# Patient Record
Sex: Female | Born: 2000 | Race: Black or African American | Hispanic: No | Marital: Single | Smoking: Never smoker
Health system: Southern US, Community
[De-identification: ages and names within clinical notes are randomized; demographics above are authoritative.]

## PROBLEM LIST (undated history)

## (undated) HISTORY — PX: MOUTH SURGERY: SHX715

---
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
## 2020-02-07 ENCOUNTER — Other Ambulatory Visit: Payer: Self-pay

## 2020-02-07 ENCOUNTER — Ambulatory Visit (INDEPENDENT_AMBULATORY_CARE_PROVIDER_SITE_OTHER): Payer: Medicaid Other | Admitting: *Deleted

## 2020-02-07 DIAGNOSIS — Z3042 Encounter for surveillance of injectable contraceptive: Secondary | ICD-10-CM

## 2020-02-07 NOTE — Progress Notes (Signed)
Pt is in office for Depo injection. Pt is on time for Depo. Injection given, pt tolerated well.  Pt advised to RTO 9/9-9/23 for next depo.   Pt has no other concerns today.  Administrations This Visit    medroxyPROGESTERone (DEPO-PROVERA) injection 150 mg    Admin Date 02/07/2020 Action Given Dose 150 mg Route Intramuscular Administered By Lanney Gins, CMA

## 2020-02-07 NOTE — Progress Notes (Signed)
Patient was assessed and managed by nursing staff during this encounter. I have reviewed the chart and agree with the documentation and plan. I have also made any necessary editorial changes.  Chayden Garrelts, MD 02/07/2020 12:46 PM 

## 2020-05-01 ENCOUNTER — Ambulatory Visit (INDEPENDENT_AMBULATORY_CARE_PROVIDER_SITE_OTHER): Payer: Medicaid Other

## 2020-05-01 ENCOUNTER — Other Ambulatory Visit: Payer: Self-pay

## 2020-05-01 DIAGNOSIS — Z3042 Encounter for surveillance of injectable contraceptive: Secondary | ICD-10-CM | POA: Diagnosis not present

## 2020-05-01 MED ORDER — MEDROXYPROGESTERONE ACETATE 150 MG/ML IM SUSP
150.0000 mg | INTRAMUSCULAR | Status: DC
  Administered 2020-05-01: 150 mg via INTRAMUSCULAR

## 2020-05-01 NOTE — Progress Notes (Signed)
Patient presents for Depo Injection  Last Depo Given: 02/07/20 Next Depo Due 07/17/20-07/31/20  Pt tolerated Injection well LUOQ   Pt advised to schedule next appt for Depo injection .

## 2020-05-01 NOTE — Progress Notes (Signed)
Patient was assessed and managed by nursing staff during this encounter. I have reviewed the chart and agree with the documentation and plan. I have also made any necessary editorial changes.  Caran Storck A Mariusz Jubb, MD 05/01/2020 1:18 PM   

## 2020-07-23 ENCOUNTER — Ambulatory Visit (INDEPENDENT_AMBULATORY_CARE_PROVIDER_SITE_OTHER): Payer: Medicaid Other

## 2020-07-23 ENCOUNTER — Other Ambulatory Visit: Payer: Self-pay

## 2020-07-23 DIAGNOSIS — Z3042 Encounter for surveillance of injectable contraceptive: Secondary | ICD-10-CM

## 2020-07-23 MED ORDER — MEDROXYPROGESTERONE ACETATE 150 MG/ML IM SUSP
150.0000 mg | Freq: Once | INTRAMUSCULAR | Status: AC
  Administered 2020-07-23: 150 mg via INTRAMUSCULAR

## 2020-07-23 NOTE — Progress Notes (Signed)
Patient was assessed and managed by nursing staff during this encounter. I have reviewed the chart and agree with the documentation and plan. I have also made any necessary editorial changes.  Warden Fillers, MD 07/23/2020 1:24 PM

## 2020-07-23 NOTE — Progress Notes (Signed)
GYN presents for DEPO Injection, given in RUOQ, tolerated well.  Needs AEX/DEPO Refills  Next DEPO  Feb. 23 - Mar. 9, 2021  Administrations This Visit    medroxyPROGESTERone (DEPO-PROVERA) injection 150 mg    Admin Date 07/23/2020 Action Given Dose 150 mg Route Intramuscular Administered By Maretta Bees, RMA

## 2020-08-18 ENCOUNTER — Ambulatory Visit: Payer: Medicaid Other | Admitting: Advanced Practice Midwife

## 2020-09-08 ENCOUNTER — Other Ambulatory Visit: Payer: Self-pay

## 2020-09-08 ENCOUNTER — Ambulatory Visit (INDEPENDENT_AMBULATORY_CARE_PROVIDER_SITE_OTHER): Payer: Medicaid Other | Admitting: Advanced Practice Midwife

## 2020-09-08 ENCOUNTER — Encounter: Payer: Self-pay | Admitting: Advanced Practice Midwife

## 2020-09-08 ENCOUNTER — Other Ambulatory Visit (HOSPITAL_COMMUNITY)
Admission: RE | Admit: 2020-09-08 | Discharge: 2020-09-08 | Disposition: A | Discharge status: Home / Self Care | Payer: Medicaid Other | Source: Ambulatory Visit | Attending: Advanced Practice Midwife | Admitting: Advanced Practice Midwife

## 2020-09-08 VITALS — Ht 66.0 in | Wt 138.0 lb

## 2020-09-08 DIAGNOSIS — Z0000 Encounter for general adult medical examination without abnormal findings: Secondary | ICD-10-CM | POA: Diagnosis not present

## 2020-09-08 DIAGNOSIS — Z113 Encounter for screening for infections with a predominantly sexual mode of transmission: Secondary | ICD-10-CM

## 2020-09-08 DIAGNOSIS — B3731 Acute candidiasis of vulva and vagina: Secondary | ICD-10-CM

## 2020-09-08 DIAGNOSIS — B373 Candidiasis of vulva and vagina: Secondary | ICD-10-CM

## 2020-09-08 NOTE — Progress Notes (Signed)
Subjective:     Christine Arnold is a 20 y.o. female here at [facility] for a routine exam.  Current complaints: none.  Personal health questionnaire reviewed: yes.  Do you have a primary care provider? yes Do you feel safe at home? yes    Risk factors for chronic health problems: Smoking: No Alchohol/how much: social drinker Pt BMI: Body mass index is 22.27 kg/m.   Gynecologic History No LMP recorded. Patient has had an injection. Contraception: Depo-Provera injections Last Pap: n/a. Last mammogram: n/a  Obstetric History OB History  Gravida Para Term Preterm AB Living  0 0 0 0 0 0  SAB IAB Ectopic Multiple Live Births  0 0 0 0 0     The following portions of the patient's history were reviewed and updated as appropriate: allergies, current medications, past family history, past medical history, past social history, past surgical history and problem list.  Review of Systems Pertinent items noted in HPI and remainder of comprehensive ROS otherwise negative.    Objective:   Ht 5\' 6"  (1.676 m)   Wt 138 lb (62.6 kg)   BMI 22.27 kg/m  VS reviewed, nursing note reviewed,  Constitutional: well developed, well nourished, no distress HEENT: normocephalic CV: normal rate Pulm/chest wall: normal effort Breast Exam: Deferred with low risks and shared decision making, discussed recommendation to start mammogram between 40-50 yo/  Abdomen: soft Neuro: alert and oriented x 3 Skin: warm, dry Psych: affect normal Pelvic exam: Deferred       Assessment/Plan:   1. Screening for STD (sexually transmitted disease) --Recommend use of condoms to prevent STDs  - Cervicovaginal ancillary only( [facility]) - Hepatitis B surface antigen - Hepatitis C antibody - HIV Antibody (routine testing w rflx) - RPR    2. Encounter for well woman exam without gynecological exam --Doing well, no gyn concerns, desires to continue Depo, next injection in February   Follow up in: 1 year  or as needed.   March, CNM 2:46 PM

## 2020-09-08 NOTE — Progress Notes (Signed)
[facility] GYN patient presents for Annual Exam.  Pt has received COVID-Vaccine LMP:  Periods are on/off and light  Contraception :Depo  Last Depo Injection 07/23/20  STD Screening: Desires.   Family Hx of Breast Cancer: None   CC: None

## 2020-09-09 LAB — CERVICOVAGINAL ANCILLARY ONLY
Bacterial Vaginitis (gardnerella): NEGATIVE
Candida Glabrata: NEGATIVE
Candida Vaginitis: POSITIVE — AB
Chlamydia: NEGATIVE
Comment: NEGATIVE
Comment: NEGATIVE
Comment: NEGATIVE
Comment: NEGATIVE
Comment: NEGATIVE
Comment: NORMAL
Neisseria Gonorrhea: NEGATIVE
Trichomonas: NEGATIVE

## 2020-09-09 LAB — HEPATITIS B SURFACE ANTIGEN: Hepatitis B Surface Ag: NEGATIVE

## 2020-09-09 LAB — RPR: RPR Ser Ql: NONREACTIVE

## 2020-09-09 LAB — HEPATITIS C ANTIBODY: Hep C Virus Ab: <0.1 s/co ratio (ref 0.0–0.9)

## 2020-09-09 LAB — HIV ANTIBODY (ROUTINE TESTING W REFLEX): HIV Screen 4th Generation wRfx: NONREACTIVE

## 2020-09-16 MED ORDER — FLUCONAZOLE 150 MG PO TABS: 150.0000 mg | ORAL_TABLET | Freq: Once | ORAL | 0 refills | Status: AC

## 2020-09-16 NOTE — Addendum Note (Signed)
Addended by: Sharen Counter A on: 09/16/2020 05:24 PM   Modules accepted: Orders

## 2020-10-17 ENCOUNTER — Ambulatory Visit: Payer: Medicaid Other

## 2020-10-21 ENCOUNTER — Other Ambulatory Visit: Payer: Self-pay

## 2020-10-21 DIAGNOSIS — Z3009 Encounter for other general counseling and advice on contraception: Secondary | ICD-10-CM

## 2020-10-21 MED ORDER — MEDROXYPROGESTERONE ACETATE 150 MG/ML IM SUSP: 150.0000 mg | INTRAMUSCULAR | 4 refills | Status: DC

## 2020-10-21 NOTE — Progress Notes (Signed)
Refills for DEPO sent to pharmacy.

## 2020-12-26 ENCOUNTER — Other Ambulatory Visit: Payer: Self-pay

## 2020-12-26 ENCOUNTER — Emergency Department (HOSPITAL_COMMUNITY)
Admission: EM | Admit: 2020-12-26 | Discharge: 2020-12-27 | Disposition: A | Discharge status: Home / Self Care | Payer: Medicaid Other | Attending: Emergency Medicine | Admitting: Emergency Medicine

## 2020-12-26 ENCOUNTER — Encounter (HOSPITAL_COMMUNITY): Payer: Self-pay | Admitting: Emergency Medicine

## 2020-12-26 DIAGNOSIS — R300 Dysuria: Secondary | ICD-10-CM | POA: Diagnosis present

## 2020-12-26 DIAGNOSIS — N3000 Acute cystitis without hematuria: Secondary | ICD-10-CM | POA: Diagnosis not present

## 2020-12-26 LAB — URINALYSIS, ROUTINE W REFLEX MICROSCOPIC
Bacteria, UA: NONE SEEN
Bilirubin Urine: NEGATIVE
Glucose, UA: NEGATIVE mg/dL
Ketones, ur: NEGATIVE mg/dL
Nitrite: NEGATIVE
Protein, ur: 100 mg/dL — AB
RBC / HPF: >50 RBC/hpf — ABNORMAL HIGH (ref 0–5)
Specific Gravity, Urine: 1.018 (ref 1.005–1.030)
WBC, UA: >50 WBC/hpf — ABNORMAL HIGH (ref 0–5)
pH: 6 (ref 5.0–8.0)

## 2020-12-26 LAB — PREGNANCY, URINE: Preg Test, Ur: NEGATIVE

## 2020-12-26 NOTE — ED Provider Notes (Signed)
Emergency Medicine Provider Triage Evaluation Note  Christine Arnold , a 20 y.o. female  was evaluated in triage.  Pt complains of dysuria and right side pain x3 days.  Denies fevers, chills, nausea, vomiting.  Review of Systems  Positive: Dysuria, frequency Negative: Vaginal discharge or bleeding  Physical Exam  BP 114/77 (BP Location: Left Arm)   Pulse 93   Temp 98.2 F (36.8 C) (Oral)   Resp 18   Ht 5\' 6"  (1.676 m)   Wt 59.9 kg   SpO2 95%   BMI 21.31 kg/m  Gen:   Awake, no distress   Resp:  Normal effort  MSK:   Moves extremities without difficulty  Other:  No CVA tenderness, abdomen soft nontender.  Medical Decision Making  Medically screening exam initiated at 9:15 PM.  Appropriate orders placed.  [name] was informed that the remainder of the evaluation will be completed by another provider, this initial triage assessment does not replace that evaluation, and the importance of remaining in the ED until their evaluation is complete.  Reports compliance with Depo, does not believe she is pregnant.   Lynnette Caffey, PA-C 12/26/20 2115    2116, MD 12/26/20 [phone]

## 2020-12-26 NOTE — ED Triage Notes (Signed)
Patient is complaining of burning when urinating x 3 days. She also have right lower back pain.

## 2020-12-27 MED ORDER — CEPHALEXIN 500 MG PO CAPS
500.0000 mg | ORAL_CAPSULE | Freq: Once | ORAL | Status: AC
  Administered 2020-12-27: 500 mg via ORAL
  Filled 2020-12-27: qty 1

## 2020-12-27 MED ORDER — CEPHALEXIN 500 MG PO CAPS: 500.0000 mg | ORAL_CAPSULE | Freq: Two times a day (BID) | ORAL | 0 refills | Status: DC

## 2020-12-27 NOTE — ED Provider Notes (Signed)
[facility]-EMERGENCY DEPT Provider Note   CSN: [id number] Arrival date & time: 12/26/20  2035     History Chief Complaint  Patient presents with  . Urinary Tract Infection    Christine Arnold is a 20 y.o. female.  Patient presents to the emergency department with a chief complaint of dysuria.  She reports that the symptoms started earlier this week.  She denies any hematuria.  Denies any lower abdominal pain.  Denies any new or unusual vaginal discharge or bleeding.  She denies any fever, nausea, or vomiting.  She states that this feels like UTI, which she has had before in the past.  She denies any treatment prior to arrival.  The history is provided by the patient. No language interpreter was used.       History reviewed. No pertinent past medical history.  Patient Active Problem List   Diagnosis Date Noted  . Depo-Provera contraceptive status 02/06/2019  . Breakthrough bleeding on depo provera 02/06/2019  . PCOS (polycystic ovarian syndrome) 12/26/2017  . Irregular menses 12/14/2017    Past Surgical History:  Procedure Laterality Date  . MOUTH SURGERY     wisdom teeth     OB History    Gravida  0   Para  0   Term  0   Preterm  0   AB  0   Living  0     SAB  0   IAB  0   Ectopic  0   Multiple  0   Live Births  0           Family History  Problem Relation Age of Onset  . Glaucoma Maternal Grandmother     Social History   Tobacco Use  . Smoking status: Never Smoker  . Smokeless tobacco: Never Used  Vaping Use  . Vaping Use: Never used  Substance Use Topics  . Alcohol use: Yes    Comment: Socially  . Drug use: Yes    Types: Marijuana    Comment: Parents are unaware    Home Medications Prior to Admission medications   Medication Sig Start Date End Date Taking? Authorizing Provider  cephALEXin (KEFLEX) 500 MG capsule Take 1 capsule (500 mg total) by mouth 2 (two) times daily. 12/27/20  Yes Roxy Horseman,  PA-C  medroxyPROGESTERone (DEPO-PROVERA) 150 MG/ML injection Inject 1 mL (150 mg total) into the muscle every 3 (three) months. 10/21/20   Leftwich-Kirby, Wilmer Floor, CNM  norgestimate-ethinyl estradiol (ORTHO-CYCLEN) 0.25-35 MG-MCG tablet Take 1 tablet by mouth daily. Patient not taking: No sig reported 02/06/19   Leftwich-Kirby, Misty Stanley A, CNM  Prenatal-Fe Fum-Methf-FA w/o A (VITAFOL-NANO) 18-0.6-0.4 MG TABS Take 1 tablet by mouth daily. Patient not taking: No sig reported 12/28/17   Orvilla Cornwall A, CNM    Allergies    Patient has no known allergies.  Review of Systems   Review of Systems  All other systems reviewed and are negative.   Physical Exam Updated Vital Signs BP 127/90   Pulse 86   Temp 98.3 F (36.8 C) (Oral)   Resp 16   Ht 5\' 6"  (1.676 m)   Wt 59.9 kg   SpO2 100%   BMI 21.31 kg/m   Physical Exam Vitals and nursing note reviewed.  Constitutional:      General: She is not in acute distress.    Appearance: She is well-developed.  HENT:     Head: Normocephalic and atraumatic.  Eyes:     Conjunctiva/sclera: Conjunctivae  normal.  Cardiovascular:     Rate and Rhythm: Normal rate.     Heart sounds: No murmur heard.   Pulmonary:     Effort: Pulmonary effort is normal. No respiratory distress.  Abdominal:     General: There is no distension.  Musculoskeletal:     Cervical back: Neck supple.     Comments: Moves all extremities  Skin:    General: Skin is warm and dry.  Neurological:     Mental Status: She is alert and oriented to person, place, and time.  Psychiatric:        Mood and Affect: Mood normal.        Behavior: Behavior normal.     ED Results / Procedures / Treatments   Labs (all labs ordered are listed, but only abnormal results are displayed) Labs Reviewed  URINALYSIS, ROUTINE W REFLEX MICROSCOPIC - Abnormal; Notable for the following components:      Result Value   APPearance CLOUDY (*)    Hgb urine dipstick MODERATE (*)    Protein, ur 100  (*)    Leukocytes,Ua LARGE (*)    RBC / HPF >50 (*)    WBC, UA >50 (*)    All other components within normal limits  PREGNANCY, URINE    EKG None  Radiology No results found.  Procedures Procedures   Medications Ordered in ED Medications  cephALEXin (KEFLEX) capsule 500 mg (500 mg Oral Given 12/27/20 0031)    ED Course  I have reviewed the triage vital signs and the nursing notes.  Pertinent labs & imaging results that were available during my care of the patient were reviewed by me and considered in my medical decision making (see chart for details).    MDM Rules/Calculators/A&P                          Patient here with dysuria.  Urinalysis is consistent with UTI.  Will treat with Keflex.  Pregnancy test negative.  No focal abdominal pain on exam.  Vital signs are stable.  Final Clinical Impression(s) / ED Diagnoses Final diagnoses:  Acute cystitis without hematuria    Rx / DC Orders ED Discharge Orders         Ordered    cephALEXin (KEFLEX) 500 MG capsule  2 times daily        12/27/20 0033           Roxy Horseman, PA-C 12/27/20 0035    Glynn Octave, MD 12/27/20 [phone]

## 2021-01-08 ENCOUNTER — Other Ambulatory Visit: Payer: Self-pay

## 2021-01-08 ENCOUNTER — Ambulatory Visit (HOSPITAL_COMMUNITY)
Admission: EM | Admit: 2021-01-08 | Discharge: 2021-01-08 | Disposition: A | Discharge status: Home / Self Care | Payer: Medicaid Other | Attending: Psychiatry | Admitting: Psychiatry

## 2021-01-08 DIAGNOSIS — F39 Unspecified mood [affective] disorder: Secondary | ICD-10-CM | POA: Insufficient documentation

## 2021-01-08 DIAGNOSIS — F411 Generalized anxiety disorder: Secondary | ICD-10-CM | POA: Insufficient documentation

## 2021-01-08 NOTE — Discharge Instructions (Addendum)
   Please come to [facility] (this facility) during walk in hours for appointment with psychiatrist for further medication management and for therapists for therapy.    Walk in hours are 8-11 AM Monday through Thursday for medication management.Child and adolescent psychiatrists are only available on Wednesdays and Thursdays during walk in hours.  Therapy walk in hours are Monday-Wednesday 8 AM-1PM.   It is first come, first -serve; it is best to arrive by 7:00 AM.   On Friday from 1 pm to 4 pm for therapy intake only. Please arrive by 12:00 pm as it is  first come, first -serve.    When you arrive please go upstairs for your appointment. If you are unsure of where to go, inform the front desk that you are here for a walk in appointment and they will assist you with directions upstairs.  Address:  [address] in [place], [address] Ph: [phone]   In the event of worsening symptoms, patient is instructed to call the crisis hotline, 911 and or go to the nearest ED for appropriate evaluation and treatment of symptoms. To follow-up with his/her primary care provider for your other medical issues, concerns and or health care needs.

## 2021-01-08 NOTE — BH Assessment (Signed)
TTS triage: Patient presents to [facility] requesting assessment. She states "I'm very overwhelmed, I'm in and out of crying, I can't control my mood, I don't want to get out of bed." Patient endorses a history of passive SI. She denies SI/HI/AVH at this time. She reports using THC earlier this date.  Patient is routine.

## 2021-01-08 NOTE — ED Provider Notes (Signed)
Behavioral Health Urgent Care Medical Screening Exam  Patient Name: Christine Arnold MRN: [id number] Date of Evaluation: 01/08/21 Chief Complaint:   Diagnosis:  Final diagnoses:  Unspecified mood (affective) disorder (HCC)  Anxiety state    History of Present illness: Christine Arnold is a 20 y.o. female with no past psychiatric history who presents voluntarily for assessment of feeling overwhelmed. Pt states that she has been "feeling all over the place. I can't think straight, and I was crying for the last day". She goes on to state that she feels that her emotions have been "all over the place" for the last year "on and of"f. She states that for the most part she feels "fine" but that she has periods where "I cant be happy"; this will last anywhere from a few hours to 2 weeks. She states that she also has periods of 2-3 days where she feels "hyperactive" and that "I can get everything done". She states that the last time this occurred was the week of May 6th when she was moving out of her on campus housing at [employer] and studyding for exams and working full time. She denies decreased need for sleep and grandiosity during these periods of time. She states that during the most recent episodes she made some impulse purchases and spent about 200-300 dollars on food and things for her father. She states that she recently bought a sewing machine that was impulsive since she wants to learn how to sew. She denies SI, plan or intent. She reports passive SI at times but cites her family and friends as a reason she would never harm herself. She cites her best friend in [place] and her boyfriend in [place] as her support system and feels that she can talk to them. She denies HI/AVH/paranoia. She states that her appetite is low and has been for the last 2 years, she states that she smokes marijuana to help with this. She reports losing about 20 lbs at one point but did gain most of this back.   She reports stressors as "life in general", school and "not being able to be in 2 places at once" as she wants to spend time with her family but also people in [place]. She is currently home in [place] for the summer and is spending time between 3 different houses-her mother, sister, and grandfather. She reports that she had seen a counselor a few years ago but is interested in establishing care with one here and would be interested in medications as well. Discussed open access hours at the [facility] for medication and therapy. Consulted with TTS counselor who provided me with outpatient resources for [facility]; these were handed directly to the patient. Patient states that she will come tomorrow for walk in counseling appointment.  Past Psychiatric History: Previous Medication Trials: no Previous Psychiatric Hospitalizations: no Previous Suicide Attempts: no History of Violence: no Outpatient psychiatrist: no  Social History: Marital Status: not married Children: 0 Source of Income: not currently employed Education: sophomore at [employer] Ed: no Housing Status: with family Easy access to gun: no  Substance Use (with emphasis over the last 12 months) Recreational Drugs: marijuana Use of Alcohol: occasional, social use Tobacco Use: yes, occasional vape Rehab History: no H/O Complicated Withdrawal: no  Legal History: Past Charges/Incarcerations: no Pending charges: no  Family Psychiatric History: Mother- bipolar and GAD Sister-bipolar   Psychiatric Specialty Exam  Presentation  General Appearance:Appropriate for Environment; Casual  Eye Contact:Good  Speech:Clear and Coherent; Normal  Rate  Speech Volume:Normal  Handedness:No data recorded  Mood and Affect  Mood:Euthymic  Affect:Congruent; Appropriate   Thought Process  Thought Processes:Coherent; Goal Directed; Linear  Descriptions of Associations:Intact  Orientation:Full (Time, Place and  Person)  Thought Content:WDL    Hallucinations:None  Ideas of Reference:None  Suicidal Thoughts:No  Homicidal Thoughts:No   Sensorium  Memory:Immediate Good; Recent Good; Remote Good  Judgment:Fair  Insight:Fair   Executive Functions  Concentration:Good  Attention Span:Good  Recall:Good  Fund of Knowledge:Good  Language:Good   Psychomotor Activity  Psychomotor Activity:Normal   Assets  Assets:Communication Skills; Financial Resources/Insurance; Desire for Improvement; Housing; Physical Health; Resilience; Social Support; Vocational/Educational   Sleep  Sleep:Fair  Number of hours: No data recorded  No data recorded  Physical Exam: Physical Exam Constitutional:      Appearance: Normal appearance. She is normal weight.  HENT:     Head: Normocephalic and atraumatic.  Pulmonary:     Effort: Pulmonary effort is normal.  Neurological:     Mental Status: She is alert and oriented to person, place, and time.    Review of Systems  Constitutional: Negative for chills and fever.  HENT: Negative for hearing loss.   Eyes: Negative for discharge and redness.  Respiratory: Negative for cough.   Cardiovascular: Negative for chest pain.  Gastrointestinal: Negative for abdominal pain.  Musculoskeletal: Negative for myalgias.  Neurological: Negative for headaches.  Psychiatric/Behavioral: Positive for depression. Negative for suicidal ideas.   Blood pressure (!) 132/96, pulse 100, temperature 98 F (36.7 C), temperature source Oral, resp. rate 18, SpO2 98 %. There is no height or weight on file to calculate BMI.  Musculoskeletal: Strength & Muscle Tone: within normal limits Gait & Station: normal Patient leans: N/A   [facility] MSE Discharge Disposition for Follow up and Recommendations: Based on my evaluation the patient does not appear to have an emergency medical condition and can be discharged with resources and follow up care in outpatient services for  Medication Management and Individual Therapy   Estella Husk, MD 01/08/2021, 3:54 PM

## 2021-01-08 NOTE — ED Notes (Signed)
Pt discharged in no acute distress. Verbalized understanding of resources provided by staff on AVS along with safety plan.

## 2021-01-14 ENCOUNTER — Other Ambulatory Visit: Payer: Self-pay | Admitting: Internal Medicine

## 2021-01-15 LAB — COMPLETE METABOLIC PANEL WITH GFR
AG Ratio: 1.8 (calc) (ref 1.0–2.5)
ALT: 12 U/L (ref 5–32)
AST: 16 U/L (ref 12–32)
Albumin: 4.4 g/dL (ref 3.6–5.1)
Alkaline phosphatase (APISO): 66 U/L (ref 36–128)
BUN: 10 mg/dL (ref 7–20)
CO2: 21 mmol/L (ref 20–32)
Calcium: 9.3 mg/dL (ref 8.9–10.4)
Chloride: 107 mmol/L (ref 98–110)
Creat: 0.77 mg/dL (ref 0.50–1.00)
GFR, Est African American: 130 mL/min/{1.73_m2} (ref >=60)
GFR, Est Non African American: 112 mL/min/{1.73_m2} (ref >=60)
Globulin: 2.5 g/dL (calc) (ref 2.0–3.8)
Glucose, Bld: 83 mg/dL (ref 65–99)
Potassium: 4.1 mmol/L (ref 3.8–5.1)
Sodium: 139 mmol/L (ref 135–146)
Total Bilirubin: 0.7 mg/dL (ref 0.2–1.1)
Total Protein: 6.9 g/dL (ref 6.3–8.2)

## 2021-01-15 LAB — CBC
HCT: 40.2 % (ref 35.0–45.0)
Hemoglobin: 13.6 g/dL (ref 11.7–15.5)
MCH: 29.8 pg (ref 27.0–33.0)
MCHC: 33.8 g/dL (ref 32.0–36.0)
MCV: 88.2 fL (ref 80.0–100.0)
MPV: 9.1 fL (ref 7.5–12.5)
Platelets: 278 10*3/uL (ref 140–400)
RBC: 4.56 10*6/uL (ref 3.80–5.10)
RDW: 12 % (ref 11.0–15.0)
WBC: 5.7 10*3/uL (ref 3.8–10.8)

## 2021-01-15 LAB — LIPID PANEL
Cholesterol: 133 mg/dL (ref <170)
HDL: 38 mg/dL — ABNORMAL LOW (ref >45)
LDL Cholesterol (Calc): 78 mg/dL (calc) (ref <110)
Non-HDL Cholesterol (Calc): 95 mg/dL (calc) (ref <120)
Total CHOL/HDL Ratio: 3.5 (calc) (ref <5.0)
Triglycerides: 88 mg/dL (ref <90)

## 2021-01-15 LAB — VITAMIN D 25 HYDROXY (VIT D DEFICIENCY, FRACTURES): Vit D, 25-Hydroxy: 27 ng/mL — ABNORMAL LOW (ref 30–100)

## 2021-12-17 ENCOUNTER — Ambulatory Visit (HOSPITAL_COMMUNITY)
Admission: EM | Admit: 2021-12-17 | Discharge: 2021-12-17 | Disposition: A | Discharge status: Home / Self Care | Payer: Medicaid Other

## 2021-12-17 DIAGNOSIS — F4321 Adjustment disorder with depressed mood: Secondary | ICD-10-CM
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
## 2021-12-17 NOTE — Discharge Summary (Signed)
Christine Arnold to be D/C'd Home per NP order. An After Visit Summary was printed and given to the patient by provider. Patient escorted out and D/C home via private auto.  ?Christine Arnold  Christine Arnold  ?12/17/2021 6:42 PM ?  ?   ?

## 2021-12-17 NOTE — Discharge Instructions (Addendum)
Please follow-up with [facility] ?Address: [address] [place], [place] [address] ?Phone Number: [phone] ?Walk-in Hours: Monday- Wednesday 8a-11a. Please arrive at 7:30am. ? ?Patient is instructed prior to discharge to: ? Take all medications as prescribed by his/her mental healthcare provider. ?Report any adverse effects and or reactions from the medicines to his/her outpatient provider promptly. ?Keep all scheduled appointments, to ensure that you are getting refills on time and to avoid any interruption in your medication.  If you are unable to keep an appointment call to reschedule.  Be sure to follow-up with resources and follow-up appointments provided.  ?Patient has been instructed & cautioned: To not engage in alcohol and or illegal drug use while on prescription medicines. ?In the event of worsening symptoms, patient is instructed to call the crisis hotline, 911 and or go to the nearest ED for appropriate evaluation and treatment of symptoms. ?To follow-up with his/her primary care provider for your other medical issues, concerns and or health care needs. ?  ? ?

## 2022-01-06 ENCOUNTER — Telehealth (HOSPITAL_COMMUNITY): Payer: Self-pay

## 2022-01-06 NOTE — BH Assessment (Signed)
Care Management - [facility] Follow Up Discharges   Writer made contact with the patient.  Patient reports that received a call from [facility] and was able to set an appointment in June 2023.

## 2022-01-14 ENCOUNTER — Other Ambulatory Visit: Payer: Self-pay | Admitting: Internal Medicine

## 2022-01-15 LAB — LIPID PANEL
Cholesterol: 138 mg/dL (ref <200)
HDL: 47 mg/dL — ABNORMAL LOW (ref >=50)
LDL Cholesterol (Calc): 77 mg/dL (calc)
Non-HDL Cholesterol (Calc): 91 mg/dL (calc) (ref <130)
Total CHOL/HDL Ratio: 2.9 (calc) (ref <5.0)
Triglycerides: 56 mg/dL (ref <150)

## 2022-01-15 LAB — COMPLETE METABOLIC PANEL WITH GFR
AG Ratio: 1.9 (calc) (ref 1.0–2.5)
ALT: 12 U/L (ref 6–29)
AST: 14 U/L (ref 10–30)
Albumin: 4.5 g/dL (ref 3.6–5.1)
Alkaline phosphatase (APISO): 58 U/L (ref 31–125)
BUN: 12 mg/dL (ref 7–25)
CO2: 22 mmol/L (ref 20–32)
Calcium: 9.4 mg/dL (ref 8.6–10.2)
Chloride: 106 mmol/L (ref 98–110)
Creat: 0.57 mg/dL (ref 0.50–0.96)
Globulin: 2.4 g/dL (calc) (ref 1.9–3.7)
Glucose, Bld: 81 mg/dL (ref 65–99)
Potassium: 4.2 mmol/L (ref 3.5–5.3)
Sodium: 138 mmol/L (ref 135–146)
Total Bilirubin: 0.7 mg/dL (ref 0.2–1.2)
Total Protein: 6.9 g/dL (ref 6.1–8.1)
eGFR: 133 mL/min/{1.73_m2} (ref >=60)

## 2022-01-15 LAB — CBC
HCT: 43.4 % (ref 35.0–45.0)
Hemoglobin: 14.7 g/dL (ref 11.7–15.5)
MCH: 30.6 pg (ref 27.0–33.0)
MCHC: 33.9 g/dL (ref 32.0–36.0)
MCV: 90.2 fL (ref 80.0–100.0)
MPV: 9.2 fL (ref 7.5–12.5)
Platelets: 251 10*3/uL (ref 140–400)
RBC: 4.81 10*6/uL (ref 3.80–5.10)
RDW: 11.9 % (ref 11.0–15.0)
WBC: 4.8 10*3/uL (ref 3.8–10.8)

## 2022-01-15 LAB — VITAMIN D 25 HYDROXY (VIT D DEFICIENCY, FRACTURES): Vit D, 25-Hydroxy: 23 ng/mL — ABNORMAL LOW (ref 30–100)

## 2022-01-15 LAB — TSH: TSH: 0.73 mIU/L

## 2022-02-24 ENCOUNTER — Ambulatory Visit (HOSPITAL_COMMUNITY): Payer: No Payment, Other | Admitting: Licensed Clinical Social Worker

## 2022-05-27 ENCOUNTER — Other Ambulatory Visit (HOSPITAL_COMMUNITY)
Admission: EM | Admit: 2022-05-27 | Discharge: 2022-05-31 | Disposition: A | Discharge status: Home / Self Care | Payer: No Payment, Other | Attending: Psychiatry | Admitting: Psychiatry

## 2022-05-27 ENCOUNTER — Other Ambulatory Visit: Payer: Self-pay

## 2022-05-27 DIAGNOSIS — F332 Major depressive disorder, recurrent severe without psychotic features: Secondary | ICD-10-CM | POA: Diagnosis not present

## 2022-05-27 DIAGNOSIS — Z1152 Encounter for screening for COVID-19: Secondary | ICD-10-CM | POA: Diagnosis not present

## 2022-05-27 DIAGNOSIS — R45851 Suicidal ideations: Secondary | ICD-10-CM

## 2022-05-27 LAB — CBC WITH DIFFERENTIAL/PLATELET
Abs Immature Granulocytes: 0.01 10*3/uL (ref 0.00–0.07)
Basophils Absolute: 0 10*3/uL (ref 0.0–0.1)
Basophils Relative: 1 %
Eosinophils Absolute: 0.1 10*3/uL (ref 0.0–0.5)
Eosinophils Relative: 1 %
HCT: 39 % (ref 36.0–46.0)
Hemoglobin: 13.5 g/dL (ref 12.0–15.0)
Immature Granulocytes: 0 %
Lymphocytes Relative: 59 %
Lymphs Abs: 3.1 10*3/uL (ref 0.7–4.0)
MCH: 30.5 pg (ref 26.0–34.0)
MCHC: 34.6 g/dL (ref 30.0–36.0)
MCV: 88.2 fL (ref 80.0–100.0)
Monocytes Absolute: 0.4 10*3/uL (ref 0.1–1.0)
Monocytes Relative: 7 %
Neutro Abs: 1.7 10*3/uL (ref 1.7–7.7)
Neutrophils Relative %: 32 %
Platelets: 241 10*3/uL (ref 150–400)
RBC: 4.42 MIL/uL (ref 3.87–5.11)
RDW: 12.4 % (ref 11.5–15.5)
WBC: 5.3 10*3/uL (ref 4.0–10.5)
nRBC: 0 % (ref 0.0–0.2)

## 2022-05-27 LAB — COMPREHENSIVE METABOLIC PANEL
ALT: 13 U/L (ref 0–44)
AST: 20 U/L (ref 15–41)
Albumin: 3.9 g/dL (ref 3.5–5.0)
Alkaline Phosphatase: 59 U/L (ref 38–126)
Anion gap: 10 (ref 5–15)
BUN: 19 mg/dL (ref 6–20)
CO2: 22 mmol/L (ref 22–32)
Calcium: 9.3 mg/dL (ref 8.9–10.3)
Chloride: 106 mmol/L (ref 98–111)
Creatinine, Ser: 0.69 mg/dL (ref 0.44–1.00)
GFR, Estimated: >60 mL/min (ref >60)
Glucose, Bld: 92 mg/dL (ref 70–99)
Potassium: 4.4 mmol/L (ref 3.5–5.1)
Sodium: 138 mmol/L (ref 135–145)
Total Bilirubin: 1.2 mg/dL (ref 0.3–1.2)
Total Protein: 6.5 g/dL (ref 6.5–8.1)

## 2022-05-27 LAB — POCT PREGNANCY, URINE: Preg Test, Ur: NEGATIVE

## 2022-05-27 LAB — POCT URINE DRUG SCREEN - MANUAL ENTRY (I-SCREEN)
POC Amphetamine UR: NOT DETECTED
POC Buprenorphine (BUP): NOT DETECTED
POC Cocaine UR: NOT DETECTED
POC Marijuana UR: POSITIVE — AB
POC Methadone UR: NOT DETECTED
POC Methamphetamine UR: NOT DETECTED
POC Morphine: NOT DETECTED
POC Oxazepam (BZO): NOT DETECTED
POC Oxycodone UR: NOT DETECTED
POC Secobarbital (BAR): NOT DETECTED

## 2022-05-27 LAB — LIPID PANEL
Cholesterol: 145 mg/dL (ref 0–200)
HDL: 37 mg/dL — ABNORMAL LOW (ref >40)
LDL Cholesterol: 98 mg/dL (ref 0–99)
Total CHOL/HDL Ratio: 3.9 RATIO
Triglycerides: 48 mg/dL (ref <150)
VLDL: 10 mg/dL (ref 0–40)

## 2022-05-27 LAB — POC SARS CORONAVIRUS 2 AG: SARSCOV2ONAVIRUS 2 AG: NEGATIVE

## 2022-05-27 LAB — HEMOGLOBIN A1C
Hgb A1c MFr Bld: 5.2 % (ref 4.8–5.6)
Mean Plasma Glucose: 102.54 mg/dL

## 2022-05-27 LAB — RESP PANEL BY RT-PCR (FLU A&B, COVID) ARPGX2
Influenza A by PCR: NEGATIVE
Influenza B by PCR: NEGATIVE
SARS Coronavirus 2 by RT PCR: NEGATIVE

## 2022-05-27 LAB — TSH: TSH: 0.771 u[IU]/mL (ref 0.350–4.500)

## 2022-05-27 MED ORDER — ACETAMINOPHEN 325 MG PO TABS: 650.0000 mg | ORAL_TABLET | Freq: Four times a day (QID) | ORAL | Status: DC | PRN

## 2022-05-27 MED ORDER — MAGNESIUM HYDROXIDE 400 MG/5ML PO SUSP: 30.0000 mL | Freq: Every day | ORAL | Status: DC | PRN

## 2022-05-27 MED ORDER — SERTRALINE HCL 50 MG PO TABS
50.0000 mg | ORAL_TABLET | Freq: Every day | ORAL | Status: DC
  Administered 2022-05-27 – 2022-05-28 (×2): 50 mg via ORAL
  Filled 2022-05-27 (×2): qty 1

## 2022-05-27 MED ORDER — HYDROXYZINE HCL 25 MG PO TABS
25.0000 mg | ORAL_TABLET | Freq: Three times a day (TID) | ORAL | Status: DC | PRN
  Administered 2022-05-27 – 2022-05-29 (×2): 25 mg via ORAL
  Filled 2022-05-27 (×2): qty 1

## 2022-05-27 MED ORDER — ALUM & MAG HYDROXIDE-SIMETH 200-200-20 MG/5ML PO SUSP: 30.0000 mL | ORAL | Status: DC | PRN

## 2022-05-27 NOTE — ED Notes (Signed)
Pt in her room lying on bed. No distress noted. Will continue to monitor for safety.

## 2022-05-27 NOTE — ED Notes (Signed)
Jalessa up for dinner. Calm and cooperative. Expressed concerns of wanting something for sleep tonight. States "I am feeling better, just never thought I would be like this." No acute safety concerns at this time.
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
## 2022-05-27 NOTE — ED Notes (Signed)
In AA group meeting 

## 2022-05-27 NOTE — ED Provider Notes (Signed)
[facility] Urgent Care Continuous Assessment Admission H&P  Date: 05/27/22 Patient Name: Christine Arnold MRN: [id number] Chief Complaint: No chief complaint on file.     Diagnoses:  Final diagnoses:  Suicidal ideation  Depression, unspecified depression type  Marijuana use  Episodic mood disorder (HCC)    HPI: Christine Arnold, 21 y.o female with a history of adjustment disorder mood disorder and depression, presented to [facility] voluntarily.  According to the patient and having a really hard time, I am spiraling out of control, I am not in control of my life.  According to patient she stated she does not feel like she can be happy, angry and I keep lashing out.  According to patient she does have thoughts of suicide but stated she does not think she can get herself. Per the patient she was seeing a psychiatrist and a therapist at [facility] but she stopped seeing them because she did not have insurance.  According to patient she was taking Zoloft but the last time she took the medicine was 2-1/2 months ago because she could not afford to get it refilled. Per the patient she is currently working at hours on and she is waiting for open enrollment to start so she can get back her insurance.  According to patient she lives at home with her sister.  Triage notes: Christine Arnold- presents to [facility] voluntarily, unaccompanied at this time with complaint of worsening depression and SI, with no plan. Pt reports having difficulty managing her emotions, excessive crying, and lashing out. Pt also reports a recent break up with her boyfriend as well as a argument on Monday with her sister that resulted in her being arrested. Pt is observed very tearful and states that she is trying to hold onto her job, but her emotions are spiraling out of control. Pt was last seen at [facility] on 12/17/21 for similar presentation. Pt is not linked to psychiatrist or therapist at this time due to not having insurance, but desires to  get back on medication and begin therapy. Pt reports taking Zoloft in the past and has not had medication for over two months. Pt denies HI, AVH.  Face-to-face observation of patient, patient is alert and oriented x 4, speech is clear, patient is cooperative in answering all questions appropriately.  Upon entering the room patient is observed laying on the chair, patient is a little bit anxious, affect flat.  Patient endorsed suicidal ideations with no immediate plans but stated she do not think she can kill herself.  Patient denies HI, AVH.  Per the patient she does have paranoid feeling about life itself.  Patient reports she smoked marijuana daily, patient reported she drinks alcohol sparingly about once every 2 months.  Patient denies taking any other medicines at this time stating she was taking birth control but because she could not afford it she stopped taking it.  NP discussed with patient the importance of staying overnight observation patient is in agreement Recommend inpatient observation  [name] 2-9:   [place] ED from 12/26/2020 in [place] [facility]PT ED from 06/23/2018 in [place]  C-SSRS RISK CATEGORY No Risk No Risk        Total Time spent with patient: 20 minutes  Musculoskeletal  Strength & Muscle Tone: within normal limits Gait & Station: normal Patient leans: N/A  Psychiatric Specialty Exam  Presentation General Appearance:  Casual  Eye Contact: Fair  Speech: Clear and Coherent  Speech Volume: Normal  Handedness: Right   Mood and Affect  Mood: Anxious; Depressed  Affect: Congruent   Thought Process  Thought Processes: Coherent  Descriptions of Associations:Circumstantial  Orientation:Full (Time, Place and Person)  Thought Content:Logical    Hallucinations:Hallucinations: None  Ideas of Reference:None  Suicidal Thoughts:Suicidal Thoughts: Yes, Passive SI Passive Intent  and/or Plan: Without Plan  Homicidal Thoughts:Homicidal Thoughts: No   Sensorium  Memory: Immediate Fair  Judgment: Fair  Insight: Fair   Executive Functions  Concentration: Good  Attention Span: Good  Recall: Good  Fund of Knowledge: Good  Language: Good   Psychomotor Activity  Psychomotor Activity: Psychomotor Activity: Normal   Assets  Assets: Desire for Improvement; Resilience; Social Support   Sleep  Sleep: Sleep: Fair Number of Hours of Sleep: 5   Nutritional Assessment (For OBS and FBC admissions only) Has the patient had a weight loss or gain of 10 pounds or more in the last 3 months?: No Has the patient had a decrease in food intake/or appetite?: No Does the patient have dental problems?: No Does the patient have eating habits or behaviors that may be indicators of an eating disorder including binging or inducing vomiting?: No Has the patient recently lost weight without trying?: 0 Has the patient been eating poorly because of a decreased appetite?: 0 Malnutrition Screening Tool Score: 0    Physical Exam HENT:     Head: Normocephalic.     Nose: Nose normal.  Cardiovascular:     Rate and Rhythm: Normal rate.  Pulmonary:     Effort: Pulmonary effort is normal.  Musculoskeletal:        General: Normal range of motion.  Neurological:     General: No focal deficit present.     Mental Status: She is alert.  Psychiatric:        Mood and Affect: Mood normal.        Behavior: Behavior normal.        Thought Content: Thought content normal.        Judgment: Judgment normal.    Review of Systems  Constitutional: Negative.   HENT: Negative.    Eyes: Negative.   Respiratory: Negative.    Cardiovascular: Negative.   Gastrointestinal: Negative.   Genitourinary: Negative.   Musculoskeletal: Negative.   Skin: Negative.   Neurological: Negative.   Endo/Heme/Allergies: Negative.   Psychiatric/Behavioral:  Positive for depression,  substance abuse and suicidal ideas. The patient is nervous/anxious.     Blood pressure 108/72, pulse 71, temperature 98.9 F (37.2 C), temperature source Oral, resp. rate 16, SpO2 100 %. There is no height or weight on file to calculate BMI.  Past Psychiatric History: Mood disorder, depression, adjustment disorder.  Is the patient at risk to self? Yes  Has the patient been a risk to self in the past 6 months? Yes .    Has the patient been a risk to self within the distant past? Yes   Is the patient a risk to others? No   Has the patient been a risk to others in the past 6 months? No   Has the patient been a risk to others within the distant past? No   Past Medical History: No past medical history on file.  Past Surgical History:  Procedure Laterality Date   MOUTH SURGERY     wisdom teeth    Family History:  Family History  Problem Relation Age of Onset   Glaucoma Maternal Grandmother     Social History:  Social History  Socioeconomic History   Marital status: Single    Spouse name: Not on file   Number of children: Not on file   Years of education: Not on file   Highest education level: Not on file  Occupational History   Not on file  Tobacco Use   Smoking status: Never   Smokeless tobacco: Never  Vaping Use   Vaping Use: Never used  Substance and Sexual Activity   Alcohol use: Yes    Comment: Socially   Drug use: Yes    Types: Marijuana    Comment: Parents are unaware   Sexual activity: Yes    Partners: Male    Birth control/protection: Injection  Other Topics Concern   Not on file  Social History Narrative   Not on file   Social Determinants of Health   Financial Resource Strain: Not on file  Food Insecurity: Not on file  Transportation Needs: Not on file  Physical Activity: Not on file  Stress: Not on file  Social Connections: Not on file  Intimate Partner Violence: Not on file    SDOH:  SDOH Screenings   Tobacco Use: Low Risk  (12/26/2020)     Last Labs:  Admission on 05/27/2022  Component Date Value Ref Range Status   POC Amphetamine UR 05/27/2022 None Detected  NONE DETECTED (Cut Off Level 1000 ng/mL) Preliminary   POC Secobarbital (BAR) 05/27/2022 None Detected  NONE DETECTED (Cut Off Level 300 ng/mL) Preliminary   POC Buprenorphine (BUP) 05/27/2022 None Detected  NONE DETECTED (Cut Off Level 10 ng/mL) Preliminary   POC Oxazepam (BZO) 05/27/2022 None Detected  NONE DETECTED (Cut Off Level 300 ng/mL) Preliminary   POC Cocaine UR 05/27/2022 None Detected  NONE DETECTED (Cut Off Level 300 ng/mL) Preliminary   POC Methamphetamine UR 05/27/2022 None Detected  NONE DETECTED (Cut Off Level 1000 ng/mL) Preliminary   POC Morphine 05/27/2022 None Detected  NONE DETECTED (Cut Off Level 300 ng/mL) Preliminary   POC Methadone UR 05/27/2022 None Detected  NONE DETECTED (Cut Off Level 300 ng/mL) Preliminary   POC Oxycodone UR 05/27/2022 None Detected  NONE DETECTED (Cut Off Level 100 ng/mL) Preliminary   POC Marijuana UR 05/27/2022 Positive (A)  NONE DETECTED (Cut Off Level 50 ng/mL) Preliminary   SARSCOV2ONAVIRUS 2 AG 05/27/2022 NEGATIVE  NEGATIVE Final   Comment: (NOTE) SARS-CoV-2 antigen NOT DETECTED.   Negative results are presumptive.  Negative results do not preclude SARS-CoV-2 infection and should not be used as the sole basis for treatment or other patient management decisions, including infection  control decisions, particularly in the presence of clinical signs and  symptoms consistent with COVID-19, or in those who have been in contact with the virus.  Negative results must be combined with clinical observations, patient history, and epidemiological information. The expected result is Negative.  Fact Sheet for Patients: HandmadeRecipes.com.cy  Fact Sheet for Healthcare Providers: FuneralLife.at  This test is not yet approved or cleared by the [place] FDA and  has been  authorized for detection and/or diagnosis of SARS-CoV-2 by FDA under an Emergency Use Authorization (EUA).  This EUA will remain in effect (meaning this test can be used) for the duration of  the COV                          ID-19 declaration under Section 564(b)(1) of the Act, 21 U.S.C. section 360bbb-3(b)(1), unless the authorization is terminated or revoked sooner.    Orders Only on  01/14/2022  Component Date Value Ref Range Status   Cholesterol 01/14/2022 138  <200 mg/dL Final   HDL 01/14/2022 47 (L)  > OR = 50 mg/dL Final   Triglycerides 01/14/2022 56  <150 mg/dL Final   LDL Cholesterol (Calc) 01/14/2022 77  mg/dL (calc) Final   Comment: Reference range: <100 . Desirable range <100 mg/dL for primary prevention;   <70 mg/dL for patients with CHD or diabetic patients  with > or = 2 CHD risk factors. Marland Kitchen LDL-C is now calculated using the Martin-Hopkins  calculation, which is a validated novel method providing  better accuracy than the Friedewald equation in the  estimation of LDL-C.  Cresenciano Genre et al. Annamaria Helling. 1275;170(01): 2061-2068  (http://education.[facility].com/faq/FAQ164)    Total CHOL/HDL Ratio 01/14/2022 2.9  <5.0 (calc) Final   Non-HDL Cholesterol (Calc) 01/14/2022 91  <130 mg/dL (calc) Final   Comment: For patients with diabetes plus 1 major ASCVD risk  factor, treating to a non-HDL-C goal of <100 mg/dL  (LDL-C of <70 mg/dL) is considered a therapeutic  option.    Glucose, Bld 01/14/2022 81  65 - 99 mg/dL Final   Comment: .            Fasting reference interval .    BUN 01/14/2022 12  7 - 25 mg/dL Final   Creat 01/14/2022 0.57  0.50 - 0.96 mg/dL Final   eGFR 01/14/2022 133  > OR = 60 mL/min/1.89m Final   Comment: The eGFR is based on the CKD-EPI 2021 equation. To calculate  the new eGFR from a previous Creatinine or Cystatin C result, go to https://www.kidney.org/professionals/ kdoqi/gfr%5Fcalculator    BUN/Creatinine Ratio 074/94/4967NOT APPLICABLE  6 -  22 (calc) Final   Sodium 01/14/2022 138  135 - 146 mmol/L Final   Potassium 01/14/2022 4.2  3.5 - 5.3 mmol/L Final   Chloride 01/14/2022 106  98 - 110 mmol/L Final   CO2 01/14/2022 22  20 - 32 mmol/L Final   Calcium 01/14/2022 9.4  8.6 - 10.2 mg/dL Final   Total Protein 01/14/2022 6.9  6.1 - 8.1 g/dL Final   Albumin 01/14/2022 4.5  3.6 - 5.1 g/dL Final   Globulin 01/14/2022 2.4  1.9 - 3.7 g/dL (calc) Final   AG Ratio 01/14/2022 1.9  1.0 - 2.5 (calc) Final   Total Bilirubin 01/14/2022 0.7  0.2 - 1.2 mg/dL Final   Alkaline phosphatase (APISO) 01/14/2022 58  31 - 125 U/L Final   AST 01/14/2022 14  10 - 30 U/L Final   ALT 01/14/2022 12  6 - 29 U/L Final   Vit D, 25-Hydroxy 01/14/2022 23 (L)  30 - 100 ng/mL Final   Comment: Vitamin D Status         25-OH Vitamin D: . Deficiency:                    <20 ng/mL Insufficiency:             20 - 29 ng/mL Optimal:                 > or = 30 ng/mL . For 25-OH Vitamin D testing on patients on  D2-supplementation and patients for whom quantitation  of D2 and D3 fractions is required, the [facility]ureD(TM) 25-OH VIT D, (D2,D3), LC/MS/MS is recommended: order  code [id number](patients >272yr. See Note 1 . Note 1 . For additional information, please refer to  http://education.[facility].com/faq/FAQ199  (This link is being provided for informational/ educational purposes only.)  TSH 01/14/2022 0.73  mIU/L Final   Comment:           Reference Range .           > or = 20 Years  0.40-4.50 .                Pregnancy Ranges           First trimester    0.26-2.66           Second trimester   0.55-2.73           Third trimester    0.43-2.91    WBC 01/14/2022 4.8  3.8 - 10.8 Thousand/uL Final   RBC 01/14/2022 4.81  3.80 - 5.10 Million/uL Final   Hemoglobin 01/14/2022 14.7  11.7 - 15.5 g/dL Final   HCT 01/14/2022 43.4  35.0 - 45.0 % Final   MCV 01/14/2022 90.2  80.0 - 100.0 fL Final   MCH 01/14/2022 30.6  27.0 - 33.0 pg Final   MCHC  01/14/2022 33.9  32.0 - 36.0 g/dL Final   RDW 01/14/2022 11.9  11.0 - 15.0 % Final   Platelets 01/14/2022 251  140 - 400 Thousand/uL Final   MPV 01/14/2022 9.2  7.5 - 12.5 fL Final    Allergies: Patient has no known allergies.  PTA Medications: (Not in a hospital admission)   Medical Decision Making  Inpatient observation Meds ordered this encounter  Medications   acetaminophen (TYLENOL) tablet 650 mg   alum & mag hydroxide-simeth (MAALOX/MYLANTA) 200-200-20 MG/5ML suspension 30 mL   magnesium hydroxide (MILK OF MAGNESIA) suspension 30 mL    Lab Orders         Resp Panel by RT-PCR (Flu A&B, Covid) Anterior Nasal Swab         CBC with Differential/Platelet         Comprehensive metabolic panel         Hemoglobin A1c         Lipid panel         TSH         Pregnancy, urine         POCT Urine Drug Screen - (I-Screen)         POC SARS Coronavirus 2 Ag        Recommendations  Based on my evaluation the patient does not appear to have an emergency medical condition.  Evette Georges, NP 05/27/22  5:10 AM

## 2022-05-27 NOTE — ED Notes (Signed)
Patient is awake and sitting on the bed tearful, myself and Kia spoke with patient. Patient is sad about the state of mind she is in and feeling hopeless about life and the direction she is going in. She has support of her sister, however feels bad that she can not be there for her little sister like she would like to be right now. Patient has been given Cereal for breakfast.

## 2022-05-27 NOTE — ED Notes (Signed)
Patient is quietly lying in bed, no distress noted, will continue monitor for safety

## 2022-05-27 NOTE — ED Notes (Signed)
Christine Arnold admitted to [facility] FB Crisis. Patient A/O x4. Patient calm, cooperative and tearful during assessment. Skin assessment completed by writer with Mechele Claude, RN present. Endorses increased anxiety and depression. Endorses passive SI, states that "I don't think I could ever kill myself." Denies HI and auditory/visual hallucinations. Oriented to the unit. All forms completed. Patient states that "I am trying to keep my head on straight." Belongings remain in the locker. No acute safety concerns at this time.
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
## 2022-05-27 NOTE — ED Notes (Signed)
Pt A&O x 4, presents with suicidal ideations, no plan noted.  Denies HI or AVH.  Pt calm & cooperative, no distress noted.  Monitoring for safety.

## 2022-05-27 NOTE — BH Assessment (Signed)
Comprehensive Clinical Assessment (CCA) Note  05/27/2022 Christine Arnold [id number]  Disposition: Christine Guadeloupe, NP, recommends continuous observation for safety and stabilization with psych reassessment in the AM.   The patient demonstrates the following risk factors for suicide: Chronic risk factors for suicide include: psychiatric disorder of depression . Acute risk factors for suicide include: family or marital conflict. Protective factors for this patient include: responsibility to others (children, family), coping skills, and hope for the future. Considering these factors, the overall suicide risk at this point appears to be high. Patient is not appropriate for outpatient follow up.  Flowsheet Row ED from 12/26/2020 in [facility] from 06/23/2018 in [facility]  C-SSRS RISK CATEGORY No Risk No Risk      Christine Arnold is a 21 year old female presenting voluntary to [facility] due to [facility] with no plan. Patient denied HI, psychosis and alcohol. Patient reported needing control over her life. Patient reported stressors/triggers include recent break up with boyfriend and this past Monday she receiving a felony charge of assault with a deadly weapon (knife), stating she got into an argument with her sister, no additional information provided. Patient reported due to no longer having insurance she had to stop seeing therapist. Patient reported taking Zoloft  2.5 months ago and stopped because she couldn't afford to get refill of medications. Patient reported she is waiting for open enrollment on her job so she can get insurance and start back with medication management and therapy. Patient reported difficulty managing her emotions, evidenced by excessive crying and lashing out. Patient reported worsening depressive symptoms.   Patient resides with sister. Patient reported family discord. Patient  is currently employed. Patient reported no work related stressors. Patient was anxious, tearful and cooperative during assessment.  Chief Complaint:  Chief Complaint  Patient presents with   Suicidal   Visit Diagnosis:  Major depressive disorder  CCA Screening, Triage and Referral (STR)  Patient Reported Information How did you hear about [place]? Self  What Is the Reason for Your Visit/Call Today? Pt presents to [facility] voluntarily, unaccompanied at this time with complaint of worsening depression and SI, with no plan. Pt reports having difficulty managing her emotions, excessive crying, and lashing out. Pt also reports a recent break up with her boyfriend as well as a argument on Monday with her sister that resulted in her being arrested. Pt is observed very tearful and states that she is trying to hold onto her job, but her emotions are spiraling out of control. Pt was last seen at [facility] on 12/17/21 for similar presentation. Pt is not linked to psychiatrist or therapist at this time due to not having insurance, but desires to get back on medication and therapy. Pt reports taking Zoloft in the past and has not had medication for over two months. Pt denies HI, AVH.  How Long Has This Been Causing You Problems? > than 6 months  What Do You Feel Would Help You the Most Today? Treatment for Depression or other mood problem; Medication(s); Stress Management; Social Support   Have You Recently Had Any Thoughts About Hurting Yourself? Yes  Are You Planning to Commit Suicide/Harm Yourself At This time? No   Have you Recently Had Thoughts About Hurting Someone Christine Arnold? No  Are You Planning to Harm Someone at This Time? No  Explanation: No data recorded  Have You Used Any Alcohol or Drugs in the Past 24 Hours? Yes  How Long Ago Did You Use Drugs or Alcohol? No data recorded What Did You Use and How Much? THC, 1 gram   Do You Currently Have a Therapist/Psychiatrist? No  Name of  Therapist/Psychiatrist: No data recorded  Have You Been Recently Discharged From Any Office Practice or Programs? No  Explanation of Discharge From Practice/Program: No data recorded    CCA Screening Triage Referral Assessment Type of Contact: Face-to-Face  Telemedicine Service Delivery:   Is this Initial or Reassessment? No data recorded Date Telepsych consult ordered in [facility]:  No data recorded Time Telepsych consult ordered in [facility]:  No data recorded Location of Assessment: [facility] Assessment Services  Provider Location: [facility]   Collateral Involvement: none reported   Does Patient Have a [place]? No  Legal Guardian Contact Information: No data recorded Copy of Legal Guardianship Form: No data recorded Legal Guardian Notified of Arrival: No data recorded Legal Guardian Notified of Pending Discharge: No data recorded If Minor and Not Living with Parent(s), Who has Custody? No data recorded Is CPS involved or ever been involved? Never  Is APS involved or ever been involved? Never   Patient Determined To Be At Risk for Harm To Self or Others Based on Review of Patient Reported Information or Presenting Complaint? No data recorded Method: No data recorded Availability of Means: No data recorded Intent: No data recorded Notification Required: No data recorded Additional Information for Danger to Others Potential: No data recorded Additional Comments for Danger to Others Potential: No data recorded Are There Guns or Other Weapons in Your Home? No data recorded Types of Guns/Weapons: No data recorded Are These Weapons Safely Secured?                            No data recorded Who Could Verify You Are Able To Have These Secured: No data recorded Do You Have any Outstanding Charges, Pending Court Dates, Parole/Probation? No data recorded Contacted To Inform of Risk of Harm To Self or Others: No data recorded   Does Patient Present  under Involuntary Commitment? No  IVC Papers Initial File Date: No data recorded  [place] of Residence: [place]   Patient Currently Receiving the Following Services: Not Receiving Services   Determination of Need: Urgent (48 hours)   Options For Referral: Other: Comment; Outpatient Therapy; Medication Management; [facility] (Observation)     CCA Biopsychosocial Patient Reported Schizophrenia/Schizoaffective Diagnosis in Past: No data recorded  Strengths: self-awareness   Mental Health Symptoms Depression:   Worthlessness; Hopelessness; Fatigue; Difficulty Concentrating; Change in energy/activity; Tearfulness; Irritability   Duration of Depressive symptoms:  Duration of Depressive Symptoms: Greater than two weeks   Mania:   None   Anxiety:    Worrying; Tension; Sleep; Restlessness; Irritability; Fatigue   Psychosis:   None   Duration of Psychotic symptoms:    Trauma:   None   Obsessions:   None   Compulsions:   None   Inattention:   None   Hyperactivity/Impulsivity:   None   Oppositional/Defiant Behaviors:   None   Emotional Irregularity:   None   Other Mood/Personality Symptoms:  No data recorded   Mental Status Exam Appearance and self-care  Stature:   Average   Weight:   Average weight   Clothing:   Age-appropriate   Grooming:   Normal   Cosmetic use:   None   Posture/gait:   Normal  Motor activity:   Not Remarkable   Sensorium  Attention:   Normal   Concentration:   Normal   Orientation:   X5   Recall/memory:   Normal   Affect and Mood  Affect:   Anxious; Appropriate; Depressed   Mood:   Anxious; Depressed   Relating  Eye contact:   Normal   Facial expression:   Anxious; Depressed; Sad; Tense   Attitude toward examiner:   Cooperative   Thought and Language  Speech flow:  Normal   Thought content:   Appropriate to Mood and Circumstances   Preoccupation:   None   Hallucinations:    None   Organization:  No data recorded  [employer] of Knowledge:   Average   Intelligence:   Average   Abstraction:   Normal   Judgement:   Dangerous   Reality Testing:   -- Rich Reining)   Insight:   Lacking   Decision Making:   Impulsive   Social Functioning  Social Maturity:   Impulsive   Social Judgement:   "Street Smart"   Stress  Stressors:   Family conflict; Relationship   Coping Ability:   Overwhelmed; Exhausted   Skill Deficits:   Decision making; Self-control; Communication   Supports:   Family; Friends/Service system; Support needed     Religion: Religion/Spirituality Are You A Religious Person?:  Rich Reining)  Leisure/Recreation: Leisure / Recreation Do You Have Hobbies?:  Rich Reining)  Exercise/Diet: Exercise/Diet Do You Exercise?: No Have You Gained or Lost A Significant Amount of Weight in the Past Six Months?: No Do You Follow a Special Diet?: No Do You Have Any Trouble Sleeping?: No   CCA Employment/Education Employment/Work Situation: Employment / Work Clinical biochemist has Been Impacted by Current Illness: No Has Patient ever Been in the [employer]?: No  Education: Education Is Patient Currently Attending School?: No Last Grade Completed: 12 Did You Product manager?: No Did You Have An Individualized Education Program (IIEP):  Rich Reining) Did You Have Any Difficulty At School?:  Rich Reining) Patient's Education Has Been Impacted by Current Illness:  (uta)   CCA Family/Childhood History Family and Relationship History: Family history Marital status: Single Does patient have children?: No  Childhood History:  Childhood History By whom was/is the patient raised?: Mother Did patient suffer any verbal/emotional/physical/sexual abuse as a child?: No Did patient suffer from severe childhood neglect?: No Has patient ever been sexually abused/assaulted/raped as an adolescent or adult?: No Was the patient ever a victim of a crime or  a disaster?: No Witnessed domestic violence?:  (uta) Has patient been affected by domestic violence as an adult?:  Industrial/product designer)  Child/Adolescent Assessment:     CCA Substance Use Alcohol/Drug Use: Alcohol / Drug Use Pain Medications: see MAR Prescriptions: see MAR Over the Counter: see MAR History of alcohol / drug use?: No history of alcohol / drug abuse                         ASAM's:  Six Dimensions of Multidimensional Assessment  Dimension 1:  Acute Intoxication and/or Withdrawal Potential:      Dimension 2:  Biomedical Conditions and Complications:      Dimension 3:  Emotional, Behavioral, or Cognitive Conditions and Complications:     Dimension 4:  Readiness to Change:     Dimension 5:  Relapse, Continued use, or Continued Problem Potential:     Dimension 6:  Recovery/Living Environment:     ASAM Severity Score:  ASAM Recommended Level of Treatment:     Substance use Disorder (SUD)    Recommendations for Services/Supports/Treatments: Recommendations for Services/Supports/Treatments Recommendations For Services/Supports/Treatments: Medication Management, Individual Therapy, Other (Comment), Inpatient Hospitalization (observation)  Discharge Disposition:    DSM5 Diagnoses: Patient Active Problem List   Diagnosis Date Noted   Depo-Provera contraceptive status 02/06/2019   Breakthrough bleeding on depo provera 02/06/2019   PCOS (polycystic ovarian syndrome) 12/26/2017   Irregular menses 12/14/2017     Referrals to Alternative Service(s): Referred to Alternative Service(s):   Place:   Date:   Time:    Referred to Alternative Service(s):   Place:   Date:   Time:    Referred to Alternative Service(s):   Place:   Date:   Time:    Referred to Alternative Service(s):   Place:   Date:   Time:     Burnetta Sabin, [facility]

## 2022-05-27 NOTE — ED Notes (Signed)
Patient currently sleeping, no distress noted,  will continue to monitor patient for safety

## 2022-05-28 DIAGNOSIS — Z1152 Encounter for screening for COVID-19: Secondary | ICD-10-CM | POA: Diagnosis not present

## 2022-05-28 DIAGNOSIS — R45851 Suicidal ideations: Secondary | ICD-10-CM | POA: Diagnosis not present

## 2022-05-28 DIAGNOSIS — F332 Major depressive disorder, recurrent severe without psychotic features: Secondary | ICD-10-CM | POA: Diagnosis not present

## 2022-05-28 MED ORDER — VENLAFAXINE HCL ER 75 MG PO CP24
75.0000 mg | ORAL_CAPSULE | Freq: Every day | ORAL | Status: DC
  Administered 2022-05-29 – 2022-05-31 (×3): 75 mg via ORAL
  Filled 2022-05-28 (×3): qty 1

## 2022-05-28 NOTE — ED Notes (Signed)
Pt sleeping at present. No distress noted. Will continue to monitor for safety 

## 2022-05-28 NOTE — ED Notes (Signed)
Patient A&Ox4. Denies intent to harm self/others when asked. Denies A/VH. Patient denies any physical complaints when asked. No acute distress noted. Routine safety checks conducted according to facility protocol. Encouraged patient to notify staff if thoughts of harm toward self or others arise. Patient verbalize understanding and agreement. Will continue to monitor for safety.    

## 2022-05-28 NOTE — ED Notes (Signed)
Patient A&Ox4. Denies intent to harm self/others when asked. Denies A/VH. Patient denies any physical complaints when asked. No acute distress noted. Encouraged patient to notify staff if thoughts of harm toward self or others arise. Patient verbalize understanding and agreement. Will continue to monitor for safety.

## 2022-05-28 NOTE — ED Notes (Signed)
Patient A&Ox4. Denies intent to harm self/others when asked. Denies A/VH. Patient denies any physical complaints when asked. No acute distress noted. Sitting in dayroom puzzling. Routine safety checks conducted according to facility protocol. Encouraged patient to notify staff if thoughts of harm toward self or others arise. Patient verbalize understanding and agreement. Will continue to monitor for safety.

## 2022-05-28 NOTE — ED Notes (Signed)
Patient attending AAA meeting. 

## 2022-05-28 NOTE — ED Notes (Signed)
Pt resting in bed. A&O x4, calm and cooperative. Denies current SI/HI/AVH. No signs of distress noted. Monitoring for safety. 

## 2022-05-28 NOTE — ED Notes (Signed)
Pt asleep in bed. Respirations even and unlabored. Monitoring for safety. 

## 2022-05-28 NOTE — ED Notes (Signed)
Pt sitting in dayroom. Denies concerns or needs at present. Pt pleasant. Denies SI/HI/AVH. Pt working on puzzle. Informed pt to notify staff with any needs or concerns. Will continue to monitor for safety.
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
## 2022-05-29 DIAGNOSIS — Z1152 Encounter for screening for COVID-19: Secondary | ICD-10-CM | POA: Diagnosis not present

## 2022-05-29 DIAGNOSIS — F332 Major depressive disorder, recurrent severe without psychotic features: Secondary | ICD-10-CM | POA: Diagnosis not present

## 2022-05-29 DIAGNOSIS — R45851 Suicidal ideations: Secondary | ICD-10-CM | POA: Diagnosis not present

## 2022-05-29 NOTE — ED Notes (Signed)
Pt asleep in bed. Respirations even and unlabored. Monitoring for safety. 

## 2022-05-29 NOTE — ED Notes (Signed)
Snacks given 

## 2022-05-29 NOTE — ED Provider Notes (Signed)
Behavioral Health Progress Note  Date and Time: 05/29/2022 1:16 PM Name: Christine Arnold MRN:  [id number]  Subjective:   Christine Arnold is a 21 yr old female who presented to [facility] on 10/12 and was admitted to [facility] for worsening depression and SI.  PPHx is significant for MDD and no History of Suicide Attempts or Hospitalizations.  She reports that she is doing better today.  She reports her mood is better.  She reports that while she still has racing thoughts they are starting to slow down.  She reports taking her first dose of the Effexor this morning.  She reports her stomach is feeling a little different but thinks it is due to the coffee from this morning and not her first dose of Effexor.  Discussed that we would continue to monitor her and if needed could make further changes.  She reports no SI, HI, or AVH.  She reports her sleep is good.  She reports her appetite is doing good.  He reports no other concerns at present.  Diagnosis:  Final diagnoses:  Suicidal ideation  Severe episode of recurrent major depressive disorder, without psychotic features ([place])    Total Time spent with patient: 20 minutes  Past Psychiatric History: MDD and no History of Suicide Attempts or Hospitalizations. Past Medical History: No past medical history on file.  Past Surgical History:  Procedure Laterality Date   MOUTH SURGERY     wisdom teeth   Family History:  Family History  Problem Relation Age of Onset   Glaucoma Maternal Grandmother    Family Psychiatric  History: Mother- Bipolar Disorder, OCD Father- possible ADHD and Dyslexia Social History:  Social History   Substance and Sexual Activity  Alcohol Use Yes   Comment: Socially     Social History   Substance and Sexual Activity  Drug Use Yes   Types: Marijuana   Comment: Parents are unaware    Social History   Socioeconomic History   Marital status: Single    Spouse name: Not on file   Number of children: Not on file    Years of education: Not on file   Highest education level: Not on file  Occupational History   Not on file  Tobacco Use   Smoking status: Never   Smokeless tobacco: Never  Vaping Use   Vaping Use: Never used  Substance and Sexual Activity   Alcohol use: Yes    Comment: Socially   Drug use: Yes    Types: Marijuana    Comment: Parents are unaware   Sexual activity: Yes    Partners: Male    Birth control/protection: Injection  Other Topics Concern   Not on file  Social History Narrative   Not on file   Social Determinants of Health   Financial Resource Strain: Not on file  Food Insecurity: Not on file  Transportation Needs: Not on file  Physical Activity: Not on file  Stress: Not on file  Social Connections: Not on file   SDOH:  SDOH Screenings   Depression (PHQ2-9): High Risk (05/29/2022)  Tobacco Use: Low Risk  (12/26/2020)   Additional Social History:    Pain Medications: see MAR Prescriptions: see MAR Over the Counter: see MAR History of alcohol / drug use?: No history of alcohol / drug abuse                    Sleep: Good  Appetite:  Good  Current Medications:  Current Facility-Administered Medications  Medication  Dose Route Frequency Provider Last Rate Last Admin   acetaminophen (TYLENOL) tablet 650 mg  650 mg Oral Q6H PRN Evette Georges, NP       alum & mag hydroxide-simeth (MAALOX/MYLANTA) 200-200-20 MG/5ML suspension 30 mL  30 mL Oral Q4H PRN Evette Georges, NP       hydrOXYzine (ATARAX) tablet 25 mg  25 mg Oral TID PRN White, Patrice L, NP   25 mg at 05/27/22 2138   magnesium hydroxide (MILK OF MAGNESIA) suspension 30 mL  30 mL Oral Daily PRN Evette Georges, NP       medroxyPROGESTERone (DEPO-PROVERA) injection 150 mg  150 mg Intramuscular Q90 days Woodroe Mode, MD   150 mg at 02/07/20 1151   medroxyPROGESTERone (DEPO-PROVERA) injection 150 mg  150 mg Intramuscular Q90 days Griffin Basil, MD   150 mg at 05/01/20 1132   venlafaxine XR  (EFFEXOR-XR) 24 hr capsule 75 mg  75 mg Oral Q breakfast Corky Sox, MD   75 mg at 05/29/22 0076   Current Outpatient Medications  Medication Sig Dispense Refill   medroxyPROGESTERone (DEPO-PROVERA) 150 MG/ML injection Inject 1 mL (150 mg total) into the muscle every 3 (three) months. 1 mL 4   sertraline (ZOLOFT) 50 MG tablet Take 50 mg by mouth daily.      Labs  Lab Results:  Admission on 05/27/2022  Component Date Value Ref Range Status   SARS Coronavirus 2 by RT PCR 05/27/2022 NEGATIVE  NEGATIVE Final   Comment: (NOTE) SARS-CoV-2 target nucleic acids are NOT DETECTED.  The SARS-CoV-2 RNA is generally detectable in upper respiratory specimens during the acute phase of infection. The lowest concentration of SARS-CoV-2 viral copies this assay can detect is 138 copies/mL. A negative result does not preclude SARS-Cov-2 infection and should not be used as the sole basis for treatment or other patient management decisions. A negative result may occur with  improper specimen collection/handling, submission of specimen other than nasopharyngeal swab, presence of viral mutation(s) within the areas targeted by this assay, and inadequate number of viral copies(<138 copies/mL). A negative result must be combined with clinical observations, patient history, and epidemiological information. The expected result is Negative.  Fact Sheet for Patients:  EntrepreneurPulse.com.au  Fact Sheet for Healthcare Providers:  IncredibleEmployment.be  This test is no                          t yet approved or cleared by the [place] FDA and  has been authorized for detection and/or diagnosis of SARS-CoV-2 by FDA under an Emergency Use Authorization (EUA). This EUA will remain  in effect (meaning this test can be used) for the duration of the COVID-19 declaration under Section 564(b)(1) of the Act, 21 U.S.C.section 360bbb-3(b)(1), unless the authorization is  terminated  or revoked sooner.       Influenza A by PCR 05/27/2022 NEGATIVE  NEGATIVE Final   Influenza B by PCR 05/27/2022 NEGATIVE  NEGATIVE Final   Comment: (NOTE) The Xpert Xpress SARS-CoV-2/FLU/RSV plus assay is intended as an aid in the diagnosis of influenza from Nasopharyngeal swab specimens and should not be used as a sole basis for treatment. Nasal washings and aspirates are unacceptable for Xpert Xpress SARS-CoV-2/FLU/RSV testing.  Fact Sheet for Patients: EntrepreneurPulse.com.au  Fact Sheet for Healthcare Providers: IncredibleEmployment.be  This test is not yet approved or cleared by the [place] FDA and has been authorized for detection and/or diagnosis of SARS-CoV-2 by FDA under an  Emergency Use Authorization (EUA). This EUA will remain in effect (meaning this test can be used) for the duration of the COVID-19 declaration under Section 564(b)(1) of the Act, 21 U.S.C. section 360bbb-3(b)(1), unless the authorization is terminated or revoked.  Performed at [facility], [place] [address] [place], [place] [address]    WBC 05/27/2022 5.3  4.0 - 10.5 K/uL Final   RBC 05/27/2022 4.42  3.87 - 5.11 MIL/uL Final   Hemoglobin 05/27/2022 13.5  12.0 - 15.0 g/dL Final   HCT 05/27/2022 39.0  36.0 - 46.0 % Final   MCV 05/27/2022 88.2  80.0 - 100.0 fL Final   MCH 05/27/2022 30.5  26.0 - 34.0 pg Final   MCHC 05/27/2022 34.6  30.0 - 36.0 g/dL Final   RDW 05/27/2022 12.4  11.5 - 15.5 % Final   Platelets 05/27/2022 241  150 - 400 K/uL Final   nRBC 05/27/2022 0.0  0.0 - 0.2 % Final   Neutrophils Relative % 05/27/2022 32  % Final   Neutro Abs 05/27/2022 1.7  1.7 - 7.7 K/uL Final   Lymphocytes Relative 05/27/2022 59  % Final   Lymphs Abs 05/27/2022 3.1  0.7 - 4.0 K/uL Final   Monocytes Relative 05/27/2022 7  % Final   Monocytes Absolute 05/27/2022 0.4  0.1 - 1.0 K/uL Final   Eosinophils Relative 05/27/2022 1  % Final   Eosinophils  Absolute 05/27/2022 0.1  0.0 - 0.5 K/uL Final   Basophils Relative 05/27/2022 1  % Final   Basophils Absolute 05/27/2022 0.0  0.0 - 0.1 K/uL Final   Immature Granulocytes 05/27/2022 0  % Final   Abs Immature Granulocytes 05/27/2022 0.01  0.00 - 0.07 K/uL Final   Performed at [facility], [place] [address], [place], [place] [address]   Sodium 05/27/2022 138  135 - 145 mmol/L Final   Potassium 05/27/2022 4.4  3.5 - 5.1 mmol/L Final   Chloride 05/27/2022 106  98 - 111 mmol/L Final   CO2 05/27/2022 22  22 - 32 mmol/L Final   Glucose, Bld 05/27/2022 92  70 - 99 mg/dL Final   Glucose reference range applies only to samples taken after fasting for at least 8 hours.   BUN 05/27/2022 19  6 - 20 mg/dL Final   Creatinine, Ser 05/27/2022 0.69  0.44 - 1.00 mg/dL Final   Calcium 05/27/2022 9.3  8.9 - 10.3 mg/dL Final   Total Protein 05/27/2022 6.5  6.5 - 8.1 g/dL Final   Albumin 05/27/2022 3.9  3.5 - 5.0 g/dL Final   AST 05/27/2022 20  15 - 41 U/L Final   ALT 05/27/2022 13  0 - 44 U/L Final   Alkaline Phosphatase 05/27/2022 59  38 - 126 U/L Final   Total Bilirubin 05/27/2022 1.2  0.3 - 1.2 mg/dL Final   GFR, Estimated 05/27/2022 >60  >60 mL/min Final   Comment: (NOTE) Calculated using the CKD-EPI Creatinine Equation (2021)    Anion gap 05/27/2022 10  5 - 15 Final   Performed at [facility] [address] [place], [place] [address]   Hgb A1c MFr Bld 05/27/2022 5.2  4.8 - 5.6 % Final   Comment: (NOTE) Pre diabetes:          5.7%-6.4%  Diabetes:              >6.4%  Glycemic control for   <7.0% adults with diabetes    Mean Plasma Glucose 05/27/2022 102.54  mg/dL Final  Performed at [facility], [place] [address] [place], [place] [address]   POC Amphetamine UR 05/27/2022 None Detected  NONE DETECTED (Cut Off Level 1000 ng/mL) Preliminary   POC Secobarbital (BAR) 05/27/2022 None Detected  NONE DETECTED (Cut Off Level 300 ng/mL) Preliminary   POC Buprenorphine (BUP) 05/27/2022  None Detected  NONE DETECTED (Cut Off Level 10 ng/mL) Preliminary   POC Oxazepam (BZO) 05/27/2022 None Detected  NONE DETECTED (Cut Off Level 300 ng/mL) Preliminary   POC Cocaine UR 05/27/2022 None Detected  NONE DETECTED (Cut Off Level 300 ng/mL) Preliminary   POC Methamphetamine UR 05/27/2022 None Detected  NONE DETECTED (Cut Off Level 1000 ng/mL) Preliminary   POC Morphine 05/27/2022 None Detected  NONE DETECTED (Cut Off Level 300 ng/mL) Preliminary   POC Methadone UR 05/27/2022 None Detected  NONE DETECTED (Cut Off Level 300 ng/mL) Preliminary   POC Oxycodone UR 05/27/2022 None Detected  NONE DETECTED (Cut Off Level 100 ng/mL) Preliminary   POC Marijuana UR 05/27/2022 Positive (A)  NONE DETECTED (Cut Off Level 50 ng/mL) Preliminary   SARSCOV2ONAVIRUS 2 AG 05/27/2022 NEGATIVE  NEGATIVE Final   Comment: (NOTE) SARS-CoV-2 antigen NOT DETECTED.   Negative results are presumptive.  Negative results do not preclude SARS-CoV-2 infection and should not be used as the sole basis for treatment or other patient management decisions, including infection  control decisions, particularly in the presence of clinical signs and  symptoms consistent with COVID-19, or in those who have been in contact with the virus.  Negative results must be combined with clinical observations, patient history, and epidemiological information. The expected result is Negative.  Fact Sheet for Patients: HandmadeRecipes.com.cy  Fact Sheet for Healthcare Providers: FuneralLife.at  This test is not yet approved or cleared by the [place] FDA and  has been authorized for detection and/or diagnosis of SARS-CoV-2 by FDA under an Emergency Use Authorization (EUA).  This EUA will remain in effect (meaning this test can be used) for the duration of  the COV                          ID-19 declaration under Section 564(b)(1) of the Act, 21 U.S.C. section 360bbb-3(b)(1), unless  the authorization is terminated or revoked sooner.     Preg Test, Ur 05/27/2022 NEGATIVE  NEGATIVE Final   Comment:        THE SENSITIVITY OF THIS METHODOLOGY IS >24 mIU/mL    Cholesterol 05/27/2022 145  0 - 200 mg/dL Final   Triglycerides 05/27/2022 48  <150 mg/dL Final   HDL 05/27/2022 37 (L)  >40 mg/dL Final   Total CHOL/HDL Ratio 05/27/2022 3.9  RATIO Final   VLDL 05/27/2022 10  0 - 40 mg/dL Final   LDL Cholesterol 05/27/2022 98  0 - 99 mg/dL Final   Comment:        Total Cholesterol/HDL:CHD Risk Coronary Heart Disease Risk Table                     Men   Women  1/2 Average Risk   3.4   3.3  Average Risk       5.0   4.4  2 X Average Risk   9.6   7.1  3 X Average Risk  23.4   11.0        Use the calculated Patient Ratio above and the CHD Risk Table to determine the patient's CHD Risk.  ATP III CLASSIFICATION (LDL):  <100     mg/dL   Optimal  100-129  mg/dL   Near or Above                    Optimal  130-159  mg/dL   Borderline  160-189  mg/dL   High  >190     mg/dL   Very High Performed at [facility] [address], [place], [place] [address]    TSH 05/27/2022 0.771  0.350 - 4.500 uIU/mL Final   Comment: Performed by a 3rd Generation assay with a functional sensitivity of <=0.01 uIU/mL. Performed at [facility], [place] [address], [place], [place] [address]   Orders Only on 01/14/2022  Component Date Value Ref Range Status   Cholesterol 01/14/2022 138  <200 mg/dL Final   HDL 01/14/2022 47 (L)  > OR = 50 mg/dL Final   Triglycerides 01/14/2022 56  <150 mg/dL Final   LDL Cholesterol (Calc) 01/14/2022 77  mg/dL (calc) Final   Comment: Reference range: <100 . Desirable range <100 mg/dL for primary prevention;   <70 mg/dL for patients with CHD or diabetic patients  with > or = 2 CHD risk factors. Marland Kitchen LDL-C is now calculated using the Martin-Hopkins  calculation, which is a validated novel method providing  better accuracy than the Friedewald  equation in the  estimation of LDL-C.  Cresenciano Genre et al. Annamaria Helling. 9379;024(09): 2061-2068  (http://education.[facility].com/faq/FAQ164)    Total CHOL/HDL Ratio 01/14/2022 2.9  <5.0 (calc) Final   Non-HDL Cholesterol (Calc) 01/14/2022 91  <130 mg/dL (calc) Final   Comment: For patients with diabetes plus 1 major ASCVD risk  factor, treating to a non-HDL-C goal of <100 mg/dL  (LDL-C of <70 mg/dL) is considered a therapeutic  option.    Glucose, Bld 01/14/2022 81  65 - 99 mg/dL Final   Comment: .            Fasting reference interval .    BUN 01/14/2022 12  7 - 25 mg/dL Final   Creat 01/14/2022 0.57  0.50 - 0.96 mg/dL Final   eGFR 01/14/2022 133  > OR = 60 mL/min/1.56m Final   Comment: The eGFR is based on the CKD-EPI 2021 equation. To calculate  the new eGFR from a previous Creatinine or Cystatin C result, go to https://www.kidney.org/professionals/ kdoqi/gfr%5Fcalculator    BUN/Creatinine Ratio 073/53/2992NOT APPLICABLE  6 - 22 (calc) Final   Sodium 01/14/2022 138  135 - 146 mmol/L Final   Potassium 01/14/2022 4.2  3.5 - 5.3 mmol/L Final   Chloride 01/14/2022 106  98 - 110 mmol/L Final   CO2 01/14/2022 22  20 - 32 mmol/L Final   Calcium 01/14/2022 9.4  8.6 - 10.2 mg/dL Final   Total Protein 01/14/2022 6.9  6.1 - 8.1 g/dL Final   Albumin 01/14/2022 4.5  3.6 - 5.1 g/dL Final   Globulin 01/14/2022 2.4  1.9 - 3.7 g/dL (calc) Final   AG Ratio 01/14/2022 1.9  1.0 - 2.5 (calc) Final   Total Bilirubin 01/14/2022 0.7  0.2 - 1.2 mg/dL Final   Alkaline phosphatase (APISO) 01/14/2022 58  31 - 125 U/L Final   AST 01/14/2022 14  10 - 30 U/L Final   ALT 01/14/2022 12  6 - 29 U/L Final   Vit D, 25-Hydroxy 01/14/2022 23 (L)  30 - 100 ng/mL Final   Comment: Vitamin D Status         25-OH Vitamin D: .  Deficiency:                    <20 ng/mL Insufficiency:             20 - 29 ng/mL Optimal:                 > or = 30 ng/mL . For 25-OH Vitamin D testing on patients on  D2-supplementation  and patients for whom quantitation  of D2 and D3 fractions is required, the QuestAssureD(TM) 25-OH VIT D, (D2,D3), LC/MS/MS is recommended: order  code [phone] (patients >36yr). See Note 1 . Note 1 . For additional information, please refer to  http://education.[facility].com/faq/FAQ199  (This link is being provided for informational/ educational purposes only.)    TSH 01/14/2022 0.73  mIU/L Final   Comment:           Reference Range .           > or = 20 Years  0.40-4.50 .                Pregnancy Ranges           First trimester    0.26-2.66           Second trimester   0.55-2.73           Third trimester    0.43-2.91    WBC 01/14/2022 4.8  3.8 - 10.8 Thousand/uL Final   RBC 01/14/2022 4.81  3.80 - 5.10 Million/uL Final   Hemoglobin 01/14/2022 14.7  11.7 - 15.5 g/dL Final   HCT 01/14/2022 43.4  35.0 - 45.0 % Final   MCV 01/14/2022 90.2  80.0 - 100.0 fL Final   MCH 01/14/2022 30.6  27.0 - 33.0 pg Final   MCHC 01/14/2022 33.9  32.0 - 36.0 g/dL Final   RDW 01/14/2022 11.9  11.0 - 15.0 % Final   Platelets 01/14/2022 251  140 - 400 Thousand/uL Final   MPV 01/14/2022 9.2  7.5 - 12.5 fL Final    Blood Alcohol level:  No results found for: "ETH"  Metabolic Disorder Labs: Lab Results  Component Value Date   HGBA1C 5.2 05/27/2022   MPG 102.54 05/27/2022   Lab Results  Component Value Date   PROLACTIN 6.5 12/14/2017   Lab Results  Component Value Date   CHOL 145 05/27/2022   TRIG 48 05/27/2022   HDL 37 (L) 05/27/2022   CHOLHDL 3.9 05/27/2022   VLDL 10 05/27/2022   LDLCALC 98 05/27/2022   LDLCALC 77 01/14/2022    Therapeutic Lab Levels: No results found for: "LITHIUM" No results found for: "VALPROATE" No results found for: "CBMZ"  Physical Findings   PHQ2-9    [facility] Total Score 5  PHQ-9 Total Score 26      [facility]  C-SSRS RISK CATEGORY High Risk No Risk No Risk        Musculoskeletal  Strength & Muscle Tone: within normal limits Gait & Station: normal Patient leans: N/A  Psychiatric Specialty Exam  Presentation  General Appearance:  Appropriate for Environment; Casual  Eye Contact: Good  Speech: Clear and Coherent; Normal Rate  Speech Volume: Normal  Handedness: Right   Mood and Affect  Mood: -- ("ok")  Affect: Congruent; Appropriate  Thought Process  Thought Processes: Coherent; Goal Directed  Descriptions of Associations:Intact  Orientation:Full (Time, Place and Person)  Thought Content:Logical; WDL     Hallucinations:Hallucinations: None  Ideas of Reference:None  Suicidal Thoughts:Suicidal Thoughts: No  Homicidal Thoughts:Homicidal Thoughts: No   Sensorium  Memory: Immediate Fair; Recent Fair  Judgment: Fair  Insight: Fair   Community education officer  Concentration: Good  Attention Span: Good  Recall: Good  Fund of Knowledge: Good  Language: Good   Psychomotor Activity  Psychomotor Activity:Psychomotor Activity: Normal   Assets  Assets: Communication Skills; Desire for Improvement; Financial Resources/Insurance; Physical Health; Housing; Resilience   Sleep  Sleep:Sleep: Good   No data recorded  Physical Exam  Physical Exam Vitals and nursing note reviewed.  Constitutional:      General: She is not in acute distress.    Appearance: Normal appearance. She is normal weight. She is not ill-appearing or toxic-appearing.  HENT:     Head: Normocephalic and atraumatic.  Pulmonary:     Effort: Pulmonary effort is normal.  Musculoskeletal:        General: Normal range of motion.  Neurological:     General: No focal deficit present.     Mental Status: She is alert.    Review of  Systems  Respiratory:  Negative for cough and shortness of breath.   Cardiovascular:  Negative for chest pain.  Gastrointestinal:  Negative for abdominal pain, constipation, diarrhea, nausea and vomiting.  Neurological:  Negative for dizziness, weakness and headaches.  Psychiatric/Behavioral:  Negative for depression, hallucinations and suicidal ideas. The patient is not nervous/anxious.    Blood pressure 127/80, pulse 77, temperature 98.4 F (36.9 C), temperature source Oral, resp. rate 18, SpO2 99 %. There is no height or weight on file to calculate BMI.  Treatment Plan Summary: Daily contact with patient to assess and evaluate symptoms and progress in treatment and Medication management  Christine Arnold is a 21 yr old female who presented to [facility] on 10/12 and was admitted to [facility] for worsening depression and SI.  PPHx is significant for MDD and no History of Suicide Attempts or Hospitalizations.   Christine Arnold was started on Effexor this morning.  So far she has not had any issues with it.  We will not make any medication changes at this time.  We will continue to monitor.     MDD: -Start Effexor 75 mg today.   -Continue PRN's: Tylenol, Maalox, Atarax, Milk of Magnesia    Briant Cedar, MD 05/29/2022 1:16 PM

## 2022-05-29 NOTE — ED Notes (Signed)
Pt is alert and oriented.  Pt has bright affect and congruent mood.  Pt attended AA meeting and appeared to be engaged.    

## 2022-05-29 NOTE — Progress Notes (Signed)
Patient is presently resting in bed without issue or distress.  No complaints or withdrawal symptoms at this time.  Encouraged to make needs known to staff.  Denies avh shi or plan.  Will monitor.

## 2022-05-29 NOTE — ED Notes (Signed)
Pt is in AA group meeting 

## 2022-05-29 NOTE — ED Notes (Signed)
Patient awake and alert sitting in dayroom watching tv with peers.  Patient ate breakfast and is without distress or complaint.  Will monitor.

## 2022-05-30 DIAGNOSIS — F332 Major depressive disorder, recurrent severe without psychotic features: Secondary | ICD-10-CM | POA: Diagnosis not present

## 2022-05-30 DIAGNOSIS — R45851 Suicidal ideations: Secondary | ICD-10-CM | POA: Diagnosis not present

## 2022-05-30 DIAGNOSIS — Z1152 Encounter for screening for COVID-19: Secondary | ICD-10-CM | POA: Diagnosis not present

## 2022-05-30 MED ORDER — TRAZODONE HCL 50 MG PO TABS
50.0000 mg | ORAL_TABLET | Freq: Every day | ORAL | Status: DC
  Administered 2022-05-30: 50 mg via ORAL
  Filled 2022-05-30: qty 1

## 2022-05-30 NOTE — ED Notes (Signed)
Patient is calm and pleasant.  Awake for breakfast.  NO complaints.  Denies avh shi or plan.  Will monitor.

## 2022-05-30 NOTE — ED Provider Notes (Signed)
Behavioral Health Progress Note  Date and Time: 05/30/2022 1:20 PM Name: Christine Arnold MRN:  [id number]  Subjective:   Christine Arnold is a 21 yr old female who presented to [facility] on 10/12 and was admitted to [facility] for worsening depression and SI.  PPHx is significant for MDD and no History of Suicide Attempts or Hospitalizations.  She reports that she is doing much better.  She reports that when she woke up this morning she was not having any racing thoughts.  She reports her mood is much improved.  She reports her thinking is the clearest has been in a while and feels great.  She reports no side effects from her Effexor.  She reports she did have some mild nausea last night but it was not there for a long time.  She reports no SI, HI, or AVH.  She reports her sleep is good.  She reports her appetite is not great but is still eating a decent amount.  She reports no other concerns at present.  She also reports she is very thankful for the care she has received while here.  Diagnosis:  Final diagnoses:  Suicidal ideation  Severe episode of recurrent major depressive disorder, without psychotic features ([place])    Total Time spent with patient: 20 minutes  Past Psychiatric History: MDD and no History of Suicide Attempts or Hospitalizations. Past Medical History: No past medical history on file.  Past Surgical History:  Procedure Laterality Date   MOUTH SURGERY     wisdom teeth   Family History:  Family History  Problem Relation Age of Onset   Glaucoma Maternal Grandmother    Family Psychiatric  History: Mother- Bipolar Disorder, OCD Father- possible ADHD and Dyslexia Social History:  Social History   Substance and Sexual Activity  Alcohol Use Yes   Comment: Socially     Social History   Substance and Sexual Activity  Drug Use Yes   Types: Marijuana   Comment: Parents are unaware    Social History   Socioeconomic History   Marital status: Single    Spouse name: Not on  file   Number of children: Not on file   Years of education: Not on file   Highest education level: Not on file  Occupational History   Not on file  Tobacco Use   Smoking status: Never   Smokeless tobacco: Never  Vaping Use   Vaping Use: Never used  Substance and Sexual Activity   Alcohol use: Yes    Comment: Socially   Drug use: Yes    Types: Marijuana    Comment: Parents are unaware   Sexual activity: Yes    Partners: Male    Birth control/protection: Injection  Other Topics Concern   Not on file  Social History Narrative   Not on file   Social Determinants of Health   Financial Resource Strain: Not on file  Food Insecurity: Not on file  Transportation Needs: Not on file  Physical Activity: Not on file  Stress: Not on file  Social Connections: Not on file   SDOH:  SDOH Screenings   Depression (PHQ2-9): Low Risk  (05/29/2022)  Recent Concern: Depression (PHQ2-9) - High Risk (05/29/2022)  Tobacco Use: Low Risk  (12/26/2020)   Additional Social History:    Pain Medications: see MAR Prescriptions: see MAR Over the Counter: see MAR History of alcohol / drug use?: No history of alcohol / drug abuse  Sleep: Good  Appetite:  Fair  Current Medications:  Current Facility-Administered Medications  Medication Dose Route Frequency Provider Last Rate Last Admin   acetaminophen (TYLENOL) tablet 650 mg  650 mg Oral Q6H PRN Evette Georges, NP       alum & mag hydroxide-simeth (MAALOX/MYLANTA) 200-200-20 MG/5ML suspension 30 mL  30 mL Oral Q4H PRN Evette Georges, NP       hydrOXYzine (ATARAX) tablet 25 mg  25 mg Oral TID PRN White, Patrice L, NP   25 mg at 05/29/22 2144   magnesium hydroxide (MILK OF MAGNESIA) suspension 30 mL  30 mL Oral Daily PRN Evette Georges, NP       medroxyPROGESTERone (DEPO-PROVERA) injection 150 mg  150 mg Intramuscular Q90 days Woodroe Mode, MD   150 mg at 02/07/20 1151   medroxyPROGESTERone (DEPO-PROVERA) injection 150  mg  150 mg Intramuscular Q90 days Griffin Basil, MD   150 mg at 05/01/20 1132   venlafaxine XR (EFFEXOR-XR) 24 hr capsule 75 mg  75 mg Oral Q breakfast Corky Sox, MD   75 mg at 05/30/22 4270   Current Outpatient Medications  Medication Sig Dispense Refill   medroxyPROGESTERone (DEPO-PROVERA) 150 MG/ML injection Inject 1 mL (150 mg total) into the muscle every 3 (three) months. 1 mL 4   sertraline (ZOLOFT) 50 MG tablet Take 50 mg by mouth daily.      Labs  Lab Results:  Admission on 05/27/2022  Component Date Value Ref Range Status   SARS Coronavirus 2 by RT PCR 05/27/2022 NEGATIVE  NEGATIVE Final   Comment: (NOTE) SARS-CoV-2 target nucleic acids are NOT DETECTED.  The SARS-CoV-2 RNA is generally detectable in upper respiratory specimens during the acute phase of infection. The lowest concentration of SARS-CoV-2 viral copies this assay can detect is 138 copies/mL. A negative result does not preclude SARS-Cov-2 infection and should not be used as the sole basis for treatment or other patient management decisions. A negative result may occur with  improper specimen collection/handling, submission of specimen other than nasopharyngeal swab, presence of viral mutation(s) within the areas targeted by this assay, and inadequate number of viral copies(<138 copies/mL). A negative result must be combined with clinical observations, patient history, and epidemiological information. The expected result is Negative.  Fact Sheet for Patients:  EntrepreneurPulse.com.au  Fact Sheet for Healthcare Providers:  IncredibleEmployment.be  This test is no                          t yet approved or cleared by the [place] FDA and  has been authorized for detection and/or diagnosis of SARS-CoV-2 by FDA under an Emergency Use Authorization (EUA). This EUA will remain  in effect (meaning this test can be used) for the duration of the COVID-19 declaration  under Section 564(b)(1) of the Act, 21 U.S.C.section 360bbb-3(b)(1), unless the authorization is terminated  or revoked sooner.       Influenza A by PCR 05/27/2022 NEGATIVE  NEGATIVE Final   Influenza B by PCR 05/27/2022 NEGATIVE  NEGATIVE Final   Comment: (NOTE) The Xpert Xpress SARS-CoV-2/FLU/RSV plus assay is intended as an aid in the diagnosis of influenza from Nasopharyngeal swab specimens and should not be used as a sole basis for treatment. Nasal washings and aspirates are unacceptable for Xpert Xpress SARS-CoV-2/FLU/RSV testing.  Fact Sheet for Patients: EntrepreneurPulse.com.au  Fact Sheet for Healthcare Providers: IncredibleEmployment.be  This test is not yet approved or cleared by the [place]  FDA and has been authorized for detection and/or diagnosis of SARS-CoV-2 by FDA under an Emergency Use Authorization (EUA). This EUA will remain in effect (meaning this test can be used) for the duration of the COVID-19 declaration under Section 564(b)(1) of the Act, 21 U.S.C. section 360bbb-3(b)(1), unless the authorization is terminated or revoked.  Performed at [facility], [place] [address], [place], [place] [address]    WBC 05/27/2022 5.3  4.0 - 10.5 K/uL Final   RBC 05/27/2022 4.42  3.87 - 5.11 MIL/uL Final   Hemoglobin 05/27/2022 13.5  12.0 - 15.0 g/dL Final   HCT 05/27/2022 39.0  36.0 - 46.0 % Final   MCV 05/27/2022 88.2  80.0 - 100.0 fL Final   MCH 05/27/2022 30.5  26.0 - 34.0 pg Final   MCHC 05/27/2022 34.6  30.0 - 36.0 g/dL Final   RDW 05/27/2022 12.4  11.5 - 15.5 % Final   Platelets 05/27/2022 241  150 - 400 K/uL Final   nRBC 05/27/2022 0.0  0.0 - 0.2 % Final   Neutrophils Relative % 05/27/2022 32  % Final   Neutro Abs 05/27/2022 1.7  1.7 - 7.7 K/uL Final   Lymphocytes Relative 05/27/2022 59  % Final   Lymphs Abs 05/27/2022 3.1  0.7 - 4.0 K/uL Final   Monocytes Relative 05/27/2022 7  % Final   Monocytes Absolute  05/27/2022 0.4  0.1 - 1.0 K/uL Final   Eosinophils Relative 05/27/2022 1  % Final   Eosinophils Absolute 05/27/2022 0.1  0.0 - 0.5 K/uL Final   Basophils Relative 05/27/2022 1  % Final   Basophils Absolute 05/27/2022 0.0  0.0 - 0.1 K/uL Final   Immature Granulocytes 05/27/2022 0  % Final   Abs Immature Granulocytes 05/27/2022 0.01  0.00 - 0.07 K/uL Final   Performed at [facility], [place] [address] [place], [place] [address]   Sodium 05/27/2022 138  135 - 145 mmol/L Final   Potassium 05/27/2022 4.4  3.5 - 5.1 mmol/L Final   Chloride 05/27/2022 106  98 - 111 mmol/L Final   CO2 05/27/2022 22  22 - 32 mmol/L Final   Glucose, Bld 05/27/2022 92  70 - 99 mg/dL Final   Glucose reference range applies only to samples taken after fasting for at least 8 hours.   BUN 05/27/2022 19  6 - 20 mg/dL Final   Creatinine, Ser 05/27/2022 0.69  0.44 - 1.00 mg/dL Final   Calcium 05/27/2022 9.3  8.9 - 10.3 mg/dL Final   Total Protein 05/27/2022 6.5  6.5 - 8.1 g/dL Final   Albumin 05/27/2022 3.9  3.5 - 5.0 g/dL Final   AST 05/27/2022 20  15 - 41 U/L Final   ALT 05/27/2022 13  0 - 44 U/L Final   Alkaline Phosphatase 05/27/2022 59  38 - 126 U/L Final   Total Bilirubin 05/27/2022 1.2  0.3 - 1.2 mg/dL Final   GFR, Estimated 05/27/2022 >60  >60 mL/min Final   Comment: (NOTE) Calculated using the CKD-EPI Creatinine Equation (2021)    Anion gap 05/27/2022 10  5 - 15 Final   Performed at [facility] [address], [place], [place] [address]   Hgb A1c MFr Bld 05/27/2022 5.2  4.8 - 5.6 % Final   Comment: (NOTE) Pre diabetes:          5.7%-6.4%  Diabetes:              >6.4%  Glycemic control for   <  7.0% adults with diabetes    Mean Plasma Glucose 05/27/2022 102.54  mg/dL Final   Performed at [facility] [address], [place], [place] [address]   POC Amphetamine UR 05/27/2022 None Detected  NONE DETECTED (Cut Off Level 1000 ng/mL) Preliminary   POC Secobarbital (BAR) 05/27/2022 None  Detected  NONE DETECTED (Cut Off Level 300 ng/mL) Preliminary   POC Buprenorphine (BUP) 05/27/2022 None Detected  NONE DETECTED (Cut Off Level 10 ng/mL) Preliminary   POC Oxazepam (BZO) 05/27/2022 None Detected  NONE DETECTED (Cut Off Level 300 ng/mL) Preliminary   POC Cocaine UR 05/27/2022 None Detected  NONE DETECTED (Cut Off Level 300 ng/mL) Preliminary   POC Methamphetamine UR 05/27/2022 None Detected  NONE DETECTED (Cut Off Level 1000 ng/mL) Preliminary   POC Morphine 05/27/2022 None Detected  NONE DETECTED (Cut Off Level 300 ng/mL) Preliminary   POC Methadone UR 05/27/2022 None Detected  NONE DETECTED (Cut Off Level 300 ng/mL) Preliminary   POC Oxycodone UR 05/27/2022 None Detected  NONE DETECTED (Cut Off Level 100 ng/mL) Preliminary   POC Marijuana UR 05/27/2022 Positive (A)  NONE DETECTED (Cut Off Level 50 ng/mL) Preliminary   SARSCOV2ONAVIRUS 2 AG 05/27/2022 NEGATIVE  NEGATIVE Final   Comment: (NOTE) SARS-CoV-2 antigen NOT DETECTED.   Negative results are presumptive.  Negative results do not preclude SARS-CoV-2 infection and should not be used as the sole basis for treatment or other patient management decisions, including infection  control decisions, particularly in the presence of clinical signs and  symptoms consistent with COVID-19, or in those who have been in contact with the virus.  Negative results must be combined with clinical observations, patient history, and epidemiological information. The expected result is Negative.  Fact Sheet for Patients: HandmadeRecipes.com.cy  Fact Sheet for Healthcare Providers: FuneralLife.at  This test is not yet approved or cleared by the [place] FDA and  has been authorized for detection and/or diagnosis of SARS-CoV-2 by FDA under an Emergency Use Authorization (EUA).  This EUA will remain in effect (meaning this test can be used) for the duration of  the COV                           ID-19 declaration under Section 564(b)(1) of the Act, 21 U.S.C. section 360bbb-3(b)(1), unless the authorization is terminated or revoked sooner.     Preg Test, Ur 05/27/2022 NEGATIVE  NEGATIVE Final   Comment:        THE SENSITIVITY OF THIS METHODOLOGY IS >24 mIU/mL    Cholesterol 05/27/2022 145  0 - 200 mg/dL Final   Triglycerides 05/27/2022 48  <150 mg/dL Final   HDL 05/27/2022 37 (L)  >40 mg/dL Final   Total CHOL/HDL Ratio 05/27/2022 3.9  RATIO Final   VLDL 05/27/2022 10  0 - 40 mg/dL Final   LDL Cholesterol 05/27/2022 98  0 - 99 mg/dL Final   Comment:        Total Cholesterol/HDL:CHD Risk Coronary Heart Disease Risk Table                     Men   Women  1/2 Average Risk   3.4   3.3  Average Risk       5.0   4.4  2 X Average Risk   9.6   7.1  3 X Average Risk  23.4   11.0        Use the calculated Patient Ratio  above and the CHD Risk Table to determine the patient's CHD Risk.        ATP III CLASSIFICATION (LDL):  <100     mg/dL   Optimal  100-129  mg/dL   Near or Above                    Optimal  130-159  mg/dL   Borderline  160-189  mg/dL   High  >190     mg/dL   Very High Performed at [facility] [address] [place], [place] [address]    TSH 05/27/2022 0.771  0.350 - 4.500 uIU/mL Final   Comment: Performed by a 3rd Generation assay with a functional sensitivity of <=0.01 uIU/mL. Performed at [facility], [place] [address] [place], [place] [address]   Orders Only on 01/14/2022  Component Date Value Ref Range Status   Cholesterol 01/14/2022 138  <200 mg/dL Final   HDL 01/14/2022 47 (L)  > OR = 50 mg/dL Final   Triglycerides 01/14/2022 56  <150 mg/dL Final   LDL Cholesterol (Calc) 01/14/2022 77  mg/dL (calc) Final   Comment: Reference range: <100 . Desirable range <100 mg/dL for primary prevention;   <70 mg/dL for patients with CHD or diabetic patients  with > or = 2 CHD risk factors. Marland Kitchen LDL-C is now calculated using the Martin-Hopkins   calculation, which is a validated novel method providing  better accuracy than the Friedewald equation in the  estimation of LDL-C.  Cresenciano Genre et al. Annamaria Helling. 1601;093(23): 2061-2068  (http://education.Quest[facility].com/faq/FAQ164)    Total CHOL/HDL Ratio 01/14/2022 2.9  <5.0 (calc) Final   Non-HDL Cholesterol (Calc) 01/14/2022 91  <130 mg/dL (calc) Final   Comment: For patients with diabetes plus 1 major ASCVD risk  factor, treating to a non-HDL-C goal of <100 mg/dL  (LDL-C of <70 mg/dL) is considered a therapeutic  option.    Glucose, Bld 01/14/2022 81  65 - 99 mg/dL Final   Comment: .            Fasting reference interval .    BUN 01/14/2022 12  7 - 25 mg/dL Final   Creat 01/14/2022 0.57  0.50 - 0.96 mg/dL Final   eGFR 01/14/2022 133  > OR = 60 mL/min/1.48m Final   Comment: The eGFR is based on the CKD-EPI 2021 equation. To calculate  the new eGFR from a previous Creatinine or Cystatin C result, go to https://www.kidney.org/professionals/ kdoqi/gfr%5Fcalculator    BUN/Creatinine Ratio 055/73/2202NOT APPLICABLE  6 - 22 (calc) Final   Sodium 01/14/2022 138  135 - 146 mmol/L Final   Potassium 01/14/2022 4.2  3.5 - 5.3 mmol/L Final   Chloride 01/14/2022 106  98 - 110 mmol/L Final   CO2 01/14/2022 22  20 - 32 mmol/L Final   Calcium 01/14/2022 9.4  8.6 - 10.2 mg/dL Final   Total Protein 01/14/2022 6.9  6.1 - 8.1 g/dL Final   Albumin 01/14/2022 4.5  3.6 - 5.1 g/dL Final   Globulin 01/14/2022 2.4  1.9 - 3.7 g/dL (calc) Final   AG Ratio 01/14/2022 1.9  1.0 - 2.5 (calc) Final   Total Bilirubin 01/14/2022 0.7  0.2 - 1.2 mg/dL Final   Alkaline phosphatase (APISO) 01/14/2022 58  31 - 125 U/L Final   AST 01/14/2022 14  10 - 30 U/L Final   ALT 01/14/2022 12  6 - 29 U/L Final   Vit D, 25-Hydroxy 01/14/2022 23 (L)  30 - 100  ng/mL Final   Comment: Vitamin D Status         25-OH Vitamin D: . Deficiency:                    <20 ng/mL Insufficiency:             20 - 29 ng/mL Optimal:                  > or = 30 ng/mL . For 25-OH Vitamin D testing on patients on  D2-supplementation and patients for whom quantitation  of D2 and D3 fractions is required, the QuestAssureD(TM) 25-OH VIT D, (D2,D3), LC/MS/MS is recommended: order  code [id number] (patients >53yr). See Note 1 . Note 1 . For additional information, please refer to  http://education.[facility].com/faq/FAQ199  (This link is being provided for informational/ educational purposes only.)    TSH 01/14/2022 0.73  mIU/L Final   Comment:           Reference Range .           > or = 20 Years  0.40-4.50 .                Pregnancy Ranges           First trimester    0.26-2.66           Second trimester   0.55-2.73           Third trimester    0.43-2.91    WBC 01/14/2022 4.8  3.8 - 10.8 Thousand/uL Final   RBC 01/14/2022 4.81  3.80 - 5.10 Million/uL Final   Hemoglobin 01/14/2022 14.7  11.7 - 15.5 g/dL Final   HCT 01/14/2022 43.4  35.0 - 45.0 % Final   MCV 01/14/2022 90.2  80.0 - 100.0 fL Final   MCH 01/14/2022 30.6  27.0 - 33.0 pg Final   MCHC 01/14/2022 33.9  32.0 - 36.0 g/dL Final   RDW 01/14/2022 11.9  11.0 - 15.0 % Final   Platelets 01/14/2022 251  140 - 400 Thousand/uL Final   MPV 01/14/2022 9.2  7.5 - 12.5 fL Final    Blood Alcohol level:  No results found for: "ETH"  Metabolic Disorder Labs: Lab Results  Component Value Date   HGBA1C 5.2 05/27/2022   MPG 102.54 05/27/2022   Lab Results  Component Value Date   PROLACTIN 6.5 12/14/2017   Lab Results  Component Value Date   CHOL 145 05/27/2022   TRIG 48 05/27/2022   HDL 37 (L) 05/27/2022   CHOLHDL 3.9 05/27/2022   VLDL 10 05/27/2022   LDLCALC 98 05/27/2022   LDLCALC 77 01/14/2022    Therapeutic Lab Levels: No results found for: "LITHIUM" No results found for: "VALPROATE" No results found for: "CBMZ"  Physical Findings   PHQ2-9    Flowsheet Row [facility] PHQ-2 Total Score 2   PHQ-9 Total Score 4      [facility]  C-SSRS RISK CATEGORY High Risk No Risk No Risk        Musculoskeletal  Strength & Muscle Tone: within normal limits Gait & Station: normal Patient leans: N/A  Psychiatric Specialty Exam  Presentation  General Appearance:  Appropriate for Environment; Casual  Eye Contact: Good  Speech: Clear and Coherent; Normal Rate  Speech  Volume: Normal  Handedness: Right   Mood and Affect  Mood: Euthymic  Affect: Appropriate; Congruent   Thought Process  Thought Processes: Coherent; Goal Directed  Descriptions of Associations:Intact  Orientation:Full (Time, Place and Person)  Thought Content:Logical; WDL     Hallucinations:Hallucinations: None  Ideas of Reference:None  Suicidal Thoughts:Suicidal Thoughts: No  Homicidal Thoughts:Homicidal Thoughts: No   Sensorium  Memory: Immediate Fair; Recent Fair  Judgment: Good  Insight: Good   Executive Functions  Concentration: Good  Attention Span: Good  Recall: Good  Fund of Knowledge: Good  Language: Good   Psychomotor Activity  Psychomotor Activity: Psychomotor Activity: Normal   Assets  Assets: Desire for Improvement; Communication Skills; Financial Resources/Insurance; Physical Health; Resilience; Housing   Sleep  Sleep: Sleep: Good   No data recorded  Physical Exam  Physical Exam Vitals and nursing note reviewed.  Constitutional:      General: She is not in acute distress.    Appearance: Normal appearance. She is normal weight. She is not ill-appearing or toxic-appearing.  HENT:     Head: Normocephalic and atraumatic.  Pulmonary:     Effort: Pulmonary effort is normal.  Musculoskeletal:        General: Normal range of motion.   Neurological:     General: No focal deficit present.     Mental Status: She is alert.    Review of Systems  Respiratory:  Negative for cough and shortness of breath.   Cardiovascular:  Negative for chest pain.  Gastrointestinal:  Negative for abdominal pain, constipation, diarrhea, nausea and vomiting.  Neurological:  Negative for dizziness, weakness and headaches.  Psychiatric/Behavioral:  Negative for depression, hallucinations and suicidal ideas. The patient is not nervous/anxious.    Blood pressure 114/75, pulse 76, temperature 98.9 F (37.2 C), temperature source Oral, resp. rate 18, SpO2 100 %. There is no height or weight on file to calculate BMI.  Treatment Plan Summary: Daily contact with patient to assess and evaluate symptoms and progress in treatment and Medication management  Eldean Gates is a 21 yr old female who presented to [facility] on 10/12 and was admitted to [facility] for worsening depression and SI.  PPHx is significant for MDD and no History of Suicide Attempts or Hospitalizations.     Jaquayla has improved with starting her Effexor.  She reports that her racing thoughts have completely stopped and her mood is much better.  We will not make any medication changes at this time.  We will continue to monitor.  Could consider discharge in the next 2-3 days.     MDD: -Continue Effexor 75 mg daily     -Continue PRN's: Tylenol, Maalox, Atarax, Milk of Magnesia      Christine Cedar, MD 05/30/2022 1:20 PM

## 2022-05-30 NOTE — ED Notes (Signed)
Pt sitting in dining room watching TV. A&O x4, calm and cooperative. Denies current SI/HI/AVH. No signs of distress noted. Monitoring for safety.  

## 2022-05-30 NOTE — Progress Notes (Signed)
Patient attended a coping skills group with RN and participated well.   

## 2022-05-30 NOTE — ED Notes (Signed)
Pt asleep in bed. Respirations even and unlabored. Monitoring for safety. 

## 2022-05-31 ENCOUNTER — Encounter (HOSPITAL_COMMUNITY): Payer: Self-pay

## 2022-05-31 DIAGNOSIS — F332 Major depressive disorder, recurrent severe without psychotic features: Secondary | ICD-10-CM | POA: Diagnosis not present

## 2022-05-31 DIAGNOSIS — Z1152 Encounter for screening for COVID-19: Secondary | ICD-10-CM | POA: Diagnosis not present

## 2022-05-31 DIAGNOSIS — R45851 Suicidal ideations: Secondary | ICD-10-CM | POA: Diagnosis not present

## 2022-05-31 MED ORDER — VENLAFAXINE HCL ER 75 MG PO CP24: 75.0000 mg | ORAL_CAPSULE | Freq: Every day | ORAL | 0 refills | Status: AC

## 2022-05-31 NOTE — ED Notes (Signed)
Pt asleep in bed. Respirations even and unlabored. Monitoring for safety. 

## 2022-05-31 NOTE — Discharge Instructions (Addendum)
Good morning, Ms. Marrufo!  As stated above in the section for Follow-up Providers, you have an appointment for medication management with Eulis Canner @ 0800 on 07 June 2022.  This appointment will be in-person at the [facility] on the 2nd floor.  You also have an outpatient therapy appointment with Darol Destine @ 0800 on 10 June 2022.  This appointment will be virtual.  You should be getting a phone call with the virtual link.  If not, feel free to call [phone] or [phone] for information about that link.  Patient is instructed prior to discharge to:  Take all medications as prescribed by his/her mental healthcare provider. Report any adverse effects and or reactions from the medicines to his/her outpatient provider promptly. Keep all scheduled appointments, to ensure that you are getting refills on time and to avoid any interruption in your medication.  If you are unable to keep an appointment call to reschedule.  Be sure to follow-up with resources and follow-up appointments provided.  Patient has been instructed & cautioned: To not engage in alcohol and or illegal drug use while on prescription medicines. In the event of worsening symptoms, patient is instructed to call the crisis hotline, 911 and or go to the nearest ED for appropriate evaluation and treatment of symptoms. To follow-up with his/her primary care provider for your other medical issues, concerns and or health care needs.

## 2022-05-31 NOTE — ED Notes (Signed)
Pt, currently, on phone. No acute distress noted. Pt denies SI/HI/AVH. Pleasant and cooperative this am. Pt request "black people coloring pages". Building control surveyor off coloring pages per pt request. Safety maintained.
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
## 2022-05-31 NOTE — BH IP Treatment Plan (Signed)
Interdisciplinary Treatment and Diagnostic Plan Update  05/28/2022 Time of Session: [place] MRN: [id number]  Diagnosis:  Final diagnoses:  Suicidal ideation  Severe episode of recurrent major depressive disorder, without psychotic features ([place])     Current Medications:  Current Facility-Administered Medications  Medication Dose Route Frequency Provider Last Rate Last Admin   acetaminophen (TYLENOL) tablet 650 mg  650 mg Oral Q6H PRN Evette Georges, NP       alum & mag hydroxide-simeth (MAALOX/MYLANTA) 200-200-20 MG/5ML suspension 30 mL  30 mL Oral Q4H PRN Evette Georges, NP       hydrOXYzine (ATARAX) tablet 25 mg  25 mg Oral TID PRN White, Patrice L, NP   25 mg at 05/29/22 2144   magnesium hydroxide (MILK OF MAGNESIA) suspension 30 mL  30 mL Oral Daily PRN Evette Georges, NP       medroxyPROGESTERone (DEPO-PROVERA) injection 150 mg  150 mg Intramuscular Q90 days Woodroe Mode, MD   150 mg at 02/07/20 1151   medroxyPROGESTERone (DEPO-PROVERA) injection 150 mg  150 mg Intramuscular Q90 days Griffin Basil, MD   150 mg at 05/01/20 1132   traZODone (DESYREL) tablet 50 mg  50 mg Oral QHS Bobbitt, Shalon E, NP   50 mg at 05/30/22 2210   venlafaxine XR (EFFEXOR-XR) 24 hr capsule 75 mg  75 mg Oral Q breakfast Corky Sox, MD   75 mg at 05/30/22 2409   Current Outpatient Medications  Medication Sig Dispense Refill   medroxyPROGESTERone (DEPO-PROVERA) 150 MG/ML injection Inject 1 mL (150 mg total) into the muscle every 3 (three) months. 1 mL 4   sertraline (ZOLOFT) 50 MG tablet Take 50 mg by mouth daily.     PTA Medications: Prior to Admission medications   Medication Sig Start Date End Date Taking? Authorizing Provider  medroxyPROGESTERone (DEPO-PROVERA) 150 MG/ML injection Inject 1 mL (150 mg total) into the muscle every 3 (three) months. 10/21/20  Yes Leftwich-Kirby, Kathie Dike, CNM  sertraline (ZOLOFT) 50 MG tablet Take 50 mg by mouth daily.   Yes [provider]     Patient Stressors: Financial difficulties   Legal issue   Marital or family conflict   Occupational concerns   Substance abuse    Patient Strengths: Ability for insight  Active sense of humor  Average or above average intelligence  Capable of independent living  Engineer, drilling fund of knowledge  Motivation for treatment/growth  Physical Health  Work skills   Treatment Modalities: Medication Management, Group therapy, Case management,  1 to 1 session with clinician, Psychoeducation, Recreational therapy.   Physician Treatment Plan for Primary and Secondary Diagnosis:  Final diagnoses:  Suicidal ideation  Severe episode of recurrent major depressive disorder, without psychotic features ([place])   Long Term Goal(s): Improvement in symptoms so as ready for discharge  Short Term Goals: Patient will attend at least of 50% of the groups daily. Pt will complete the PHQ9 on admission, day 3 and discharge. Patient will take medications as prescribed daily.  Medication Management: Evaluate patient's response, side effects, and tolerance of medication regimen.  Therapeutic Interventions: 1 to 1 sessions, Unit Group sessions and Medication administration.  Evaluation of Outcomes: Progressing  LCSW Treatment Plan for Primary Diagnosis:  Final diagnoses:  Suicidal ideation  Severe episode of recurrent major depressive disorder, without psychotic features ([place])    Long Term Goal(s): Safe transition to appropriate next level of care at discharge.  Short Term Goals: Facilitate acceptance of  mental health diagnosis and concerns through verbal commitment to aftercare plan and appointments at discharge., Patient will identify one social support prior to discharge to aid in patient's recovery., Identify minimum of 2 triggers associated with mental health/substance abuse issues with treatment team members., and Increase skills for wellness and recovery by  attending 50% of scheduled groups.  Therapeutic Interventions: Assess for all discharge needs, 1 to 1 time with Child psychotherapist, Explore available resources and support systems, Assess for adequacy in community support network, Educate family and significant other(s) on suicide prevention, Complete Psychosocial Assessment, Interpersonal group therapy.  Evaluation of Outcomes: Progressing   Progress in Treatment: Attending groups: Yes. Participating in groups: Yes. Taking medication as prescribed: Yes. Toleration medication: Yes. Family/Significant other contact made: Yes, individual(s) contacted:  Younger sister who lives with her. Patient understands diagnosis: Yes. Discussing patient identified problems/goals with staff: Yes. Medical problems stabilized or resolved: Yes. Denies suicidal/homicidal ideation: Yes. Issues/concerns per patient self-inventory: Yes. Other: Health insurance and job security.  New problem(s) identified: No, Describe:  Nothing more than what was discussed in the CCA and H&P.  New Short Term/Long Term Goal(s):  "I want to be in a place where I feel secure in my emotions, my intuition."  Patient Goals:  To get assistance with therapy and medication management.  Discharge Plan or Barriers:  Outpatient therapy/med management - lack of insurance limits options.  Concerned with legal issues and work.  Reason for Continuation of Hospitalization: Medication stabilization  Estimated Length of Stay:  Last 3 [place] Suicide Severity Risk Score: Flowsheet Row ED from 05/27/2022 in [facility] ED from 12/26/2020 in [facility] ED from 06/23/2018 in [facility]  HOSPITAL-EMERGENCY DEPT  C-SSRS RISK CATEGORY High Risk No Risk No Risk       Last PHQ 2/9 Scores:    05/29/2022   10:00 AM 05/29/2022    7:02 AM  Depression screen PHQ 2/9  Decreased Interest 1 2  Down, Depressed, Hopeless 1 3  PHQ  - 2 Score 2 5  Altered sleeping 1 3  Tired, decreased energy 0 3  Change in appetite 0 3  Feeling bad or failure about yourself  1 3  Trouble concentrating 0 3  Moving slowly or fidgety/restless 0 3  Suicidal thoughts 0 3  PHQ-9 Score 4 26  Difficult doing work/chores  Very difficult    Scribe for Treatment Team: Jake Samples, LCSW 05/31/2022 8:30 AM

## 2022-05-31 NOTE — ED Notes (Signed)
Pt sleeping in no acute distress. RR even and unlabored. Safety maintained. 

## 2022-05-31 NOTE — Tx Team (Signed)
Treatment Team Meeting   Dr. Alvie Heidelberg and LCSW met with pt to discuss discharge planning.  LCSW informed pt of follow-up appointments for medication management with Eulis Canner, 07 June 2022 @ 0800 (in-person) and outpatient therapy with Darol Destine, 10 June 2022 @ 0800 (virtual).  LCSW informed pt of where the information would be on her AVS.  Pt denied SI/HI/AVH to the tx team.  Pt was able to verbalize future plans and what self-care habits and techniques she will employ when she is feeling depressed and overwhelmed.  Pt continued to present with excellent insight into her mental health and able to discern what will work best for her.     Omelia Blackwater, MSW, [place] BHUC/FBC [phone] phone [phone] work mobile
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
## 2022-05-31 NOTE — ED Notes (Signed)
Patient A&O x 4, ambulatory. Patient discharged in no acute distress. Patient denied SI/HI, A/VH upon discharge. Patient verbalized understanding of all discharge instructions explained by staff, to include follow up appointments, RX's and safety plan. Patient reported mood 10/10.  Pt belongings returned to patient from locker #8 intact. Patient escorted to lobby via staff for self transport to home via pt's car. Safety maintained.

## 2022-06-07 ENCOUNTER — Ambulatory Visit (HOSPITAL_COMMUNITY): Payer: No Payment, Other | Admitting: Psychiatry

## 2022-06-10 ENCOUNTER — Ambulatory Visit (INDEPENDENT_AMBULATORY_CARE_PROVIDER_SITE_OTHER): Payer: No Payment, Other | Admitting: Licensed Clinical Social Worker

## 2022-06-10 DIAGNOSIS — F332 Major depressive disorder, recurrent severe without psychotic features: Secondary | ICD-10-CM | POA: Diagnosis not present
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
## 2022-06-24 ENCOUNTER — Ambulatory Visit (INDEPENDENT_AMBULATORY_CARE_PROVIDER_SITE_OTHER): Payer: No Payment, Other | Admitting: Licensed Clinical Social Worker

## 2022-06-24 DIAGNOSIS — F332 Major depressive disorder, recurrent severe without psychotic features: Secondary | ICD-10-CM

## 2022-06-28 NOTE — Progress Notes (Addendum)
Virtual Visit via Video Note  I connected with Christine Arnold on 06/28/22 at  9:30 AM EST by a video enabled telemedicine application and verified that I am speaking with the correct person using two identifiers.  Location: Patient: Home Provider: Virtual office   I discussed the limitations of evaluation and management by telemedicine and the availability of in person appointments. The patient expressed understanding and agreed to proceed.  History of Present Illness: Past history of Major Depressive Disorder   Observations/Objective: Christine Arnold is present and alert during session. She reports that she has been stable since last session and her mood has improved. She reports that she has court coming up and has been trying to keep herself busy during this time. She reports that she has been attending work but she overslept last night and she plans to go tonight. She denies SI/HI, AVH or psychosis.  Assessment and Plan: Continue medication compliance and use of positive coping skills.  Follow Up Instructions: Follow up on 07/15/2022 at 11:30a   I discussed the assessment and treatment plan with the patient. The patient was provided an opportunity to ask questions and all were answered. The patient agreed with the plan and demonstrated an understanding of the instructions.   The patient was advised to call back or seek an in-person evaluation if the symptoms worsen or if the condition fails to improve as anticipated.  I provided 30 minutes of non-face-to-face time during this encounter.   Cheri Fowler, [facility]

## 2022-06-28 NOTE — Addendum Note (Signed)
Addended by: Cheri Fowler on: 06/28/2022 02:07 PM   Modules accepted: Level of Service

## 2022-07-15 ENCOUNTER — Ambulatory Visit (HOSPITAL_COMMUNITY): Payer: No Payment, Other | Admitting: Licensed Clinical Social Worker

## 2023-04-09 ENCOUNTER — Other Ambulatory Visit: Payer: Self-pay

## 2023-04-09 ENCOUNTER — Emergency Department (HOSPITAL_COMMUNITY)
Admission: EM | Admit: 2023-04-09 | Discharge: 2023-04-09 | Disposition: A | Discharge status: Home / Self Care | Payer: BLUE CROSS/BLUE SHIELD | Attending: Emergency Medicine | Admitting: Emergency Medicine

## 2023-04-09 ENCOUNTER — Encounter (HOSPITAL_COMMUNITY): Payer: Self-pay | Admitting: Emergency Medicine

## 2023-04-09 DIAGNOSIS — Z202 Contact with and (suspected) exposure to infections with a predominantly sexual mode of transmission: Secondary | ICD-10-CM

## 2023-04-09 DIAGNOSIS — N3000 Acute cystitis without hematuria: Secondary | ICD-10-CM | POA: Diagnosis not present

## 2023-04-09 DIAGNOSIS — R300 Dysuria: Secondary | ICD-10-CM | POA: Diagnosis present

## 2023-04-09 LAB — URINALYSIS, ROUTINE W REFLEX MICROSCOPIC
Bilirubin Urine: NEGATIVE
Glucose, UA: NEGATIVE mg/dL
Ketones, ur: NEGATIVE mg/dL
Nitrite: NEGATIVE
Protein, ur: 30 mg/dL — AB
Specific Gravity, Urine: 1.023 (ref 1.005–1.030)
WBC, UA: >50 WBC/hpf (ref 0–5)
pH: 6 (ref 5.0–8.0)

## 2023-04-09 LAB — HIV ANTIBODY (ROUTINE TESTING W REFLEX): HIV Screen 4th Generation wRfx: NONREACTIVE

## 2023-04-09 LAB — WET PREP, GENITAL
Clue Cells Wet Prep HPF POC: NONE SEEN
Sperm: NONE SEEN
Trich, Wet Prep: NONE SEEN
WBC, Wet Prep HPF POC: <10 (ref <10)
Yeast Wet Prep HPF POC: NONE SEEN

## 2023-04-09 MED ORDER — CEFTRIAXONE SODIUM 500 MG IJ SOLR
500.0000 mg | Freq: Once | INTRAMUSCULAR | Status: AC
  Administered 2023-04-09: 500 mg via INTRAMUSCULAR
  Filled 2023-04-09: qty 500

## 2023-04-09 MED ORDER — DOXYCYCLINE HYCLATE 100 MG PO TABS: 100.0000 mg | ORAL_TABLET | Freq: Two times a day (BID) | ORAL | 0 refills | Status: AC

## 2023-04-09 MED ORDER — LIDOCAINE HCL (PF) 1 % IJ SOLN
1.0000 mL | Freq: Once | INTRAMUSCULAR | Status: AC
  Administered 2023-04-09: 1 mL
  Filled 2023-04-09: qty 5

## 2023-04-09 MED ORDER — NITROFURANTOIN MONOHYD MACRO 100 MG PO CAPS: 100.0000 mg | ORAL_CAPSULE | Freq: Two times a day (BID) | ORAL | 0 refills | Status: AC

## 2023-04-09 NOTE — ED Triage Notes (Signed)
Pt reports she received a letter from the health department about a previous partner testing positive for chlamydia.  Pt is requesting testing for same.

## 2023-04-09 NOTE — ED Notes (Signed)
Confirmed w/ lab that GC/Chlamydia was received.  Lab reports Cytology is off on Saturdays and the test will be run tomorrow.

## 2023-04-09 NOTE — Discharge Instructions (Signed)
You were tested today for a number of sexually transmitted diseases.  The tests that we performed were for chlamydia, gonorrhea, syphilis (RPR), and HIV.  The results of these tests will not come back for 24 to 48 hours on average.  Please follow the instructions on your discharge paperwork to set up your patient portal and check for these results.  The antibiotics that you are going to take will cover for a chlamydia or gonorrhea infection so you do not need to follow-up if either of these tests are positive.  If your RPR is positive you need to present for further treatment for syphilis, and if you have a positive HIV test I also recommend that you return for further evaluation and to discuss this diagnosis.  In the meantime I would refrain from any sexual activity for at least 10 days, or until your symptoms fully resolve.  The antibiotics that you are taking can be slightly rough on your stomach, I recommend that you take them on a full stomach and swallow the pills with a large glass of water. They can also make you sensitive to the sun, so I recommend that you wear sunscreen for the entire time that you are taking them.  Please take the entire course of antibiotics, even if your symptoms resolve sooner.  Please inform any of your sexual partners of any positive diagnoses and I recommend that you engage in safe sex practices in the future including using a condom, and asking all of your sexual partners about their current STD history.  If your symptoms do not resolve despite treatment please return to the emergency department or the health department for further evaluation.

## 2023-04-09 NOTE — ED Provider Notes (Signed)
[facility] Provider Note   CSN: [id number] Arrival date & time: 04/09/23  1512     History  Chief Complaint  Patient presents with   Exposure to STD    Christine Arnold is a 22 y.o. female with overall noncontributory past medical history who presents with concern for possible exposure to chlamydia.  Patient received a letter from the health department stating that one of her previous partners had just tested positive for chlamydia.  She denies that he had any symptoms at that time, she has not had any significant pain, vaginal discharge, she does endorse some minor dysuria but thinks that she may have irritated her urethra when using a wipe earlier today.  She denies any foul odor.  Discussed with patient and she would like to have testing for chlamydia, gonorrhea as well as syphilis, HIV as well.  Denies any lower abdominal pain, fever, chills, nausea, vomiting.   Exposure to STD       Home Medications Prior to Admission medications   Medication Sig Start Date End Date Taking? Authorizing Provider  doxycycline (VIBRA-TABS) 100 MG tablet Take 1 tablet (100 mg total) by mouth 2 (two) times daily. 04/09/23  Yes Deloras Reichard H, PA-C  nitrofurantoin, macrocrystal-monohydrate, (MACROBID) 100 MG capsule Take 1 capsule (100 mg total) by mouth 2 (two) times daily. 04/09/23  Yes Reno Clasby H, PA-C  venlafaxine XR (EFFEXOR-XR) 75 MG 24 hr capsule Take 1 capsule (75 mg total) by mouth daily with breakfast. 06/01/22   Carlyn Reichert, MD      Allergies    Patient has no known allergies.    Review of Systems   Review of Systems  All other systems reviewed and are negative.   Physical Exam Updated Vital Signs BP 115/75   Pulse 75   Temp 98.3 F (36.8 C)   Resp 16   SpO2 100%  Physical Exam Vitals and nursing note reviewed.  Constitutional:      General: She is not in acute distress.    Appearance: Normal appearance.   HENT:     Head: Normocephalic and atraumatic.  Eyes:     General:        Right eye: No discharge.        Left eye: No discharge.  Cardiovascular:     Rate and Rhythm: Normal rate and regular rhythm.     Heart sounds: No murmur heard.    No friction rub. No gallop.  Pulmonary:     Effort: Pulmonary effort is normal.     Breath sounds: Normal breath sounds.  Abdominal:     General: Bowel sounds are normal.     Palpations: Abdomen is soft.     Comments: No significant tenderness to palpation lower abdomen, no rebound, rigidity, guarding  Genitourinary:    Comments: Deferred GU exam as patient is asymptomatic at this time Skin:    General: Skin is warm and dry.     Capillary Refill: Capillary refill takes less than 2 seconds.  Neurological:     Mental Status: She is alert and oriented to person, place, and time.  Psychiatric:        Mood and Affect: Mood normal.        Behavior: Behavior normal.     ED Results / Procedures / Treatments   Labs (all labs ordered are listed, but only abnormal results are displayed) Labs Reviewed  URINALYSIS, ROUTINE W REFLEX MICROSCOPIC - Abnormal; Notable for  the following components:      Result Value   APPearance HAZY (*)    Hgb urine dipstick LARGE (*)    Protein, ur 30 (*)    Leukocytes,Ua MODERATE (*)    Bacteria, UA FEW (*)    All other components within normal limits  WET PREP, GENITAL  RPR  HIV ANTIBODY (ROUTINE TESTING W REFLEX)  GC/CHLAMYDIA PROBE AMP ([place]) NOT AT [facility]    EKG None  Radiology No results found.  Procedures Procedures    Medications Ordered in ED Medications  cefTRIAXone (ROCEPHIN) injection 500 mg (500 mg Intramuscular Given 04/09/23 1613)  lidocaine (PF) (XYLOCAINE) 1 % injection 1-2.1 mL (1 mL Other Given 04/09/23 1613)    ED Course/ Medical Decision Making/ A&P                                 Medical Decision Making Amount and/or Complexity of Data Reviewed Labs:  ordered.  Risk Prescription drug management.   This patient is a 22 y.o. female  who presents to the ED for concern of possible exposure to STI.   Differential diagnoses prior to evaluation: The emergent differential diagnosis includes, but is not limited to, gonorrhea, chlamydia, syphilis, HIV, or other. This is not an exhaustive differential.   Past Medical History / Co-morbidities / Social History: Overall noncontributory  Physical Exam: Physical exam performed. The pertinent findings include: Patient with stable vitals, no acute distress, no significant abdominal tenderness on exam  Lab Tests/Imaging studies: I personally interpreted labs/imaging and the pertinent results include: Pending gonorrhea, chlamydia, RPR, HIV, discussed with patient and she agrees to treat presumptively given known exposure.  UA shows leukocytes, white blood cell and bacteria, there is some mild squamous contamination but given that she did endorse some dysuria I think that would be reasonable to go ahead and treat for urinary tract infection along with her UTI treatment., wet prep shows no acute abnormality.    Medications: I ordered medication including Rocephin, discharged with doxycycline for presumptive GC/chlamydia coverage.  I have reviewed the patients home medicines and have made adjustments as needed.   Disposition: After consideration of the diagnostic results and the patients response to treatment, I feel that patient stable for discharge, understands to follow-up on the results of her STI screening on her patient portal.  Additionally treating for urinary tract infection based on results of the urinalysis.  emergency department workup does not suggest an emergent condition requiring admission or immediate intervention beyond what has been performed at this time. The plan is: as above. The patient is safe for discharge and has been instructed to return immediately for worsening symptoms, change in  symptoms or any other concerns.  Final Clinical Impression(s) / ED Diagnoses Final diagnoses:  STD exposure  Acute cystitis without hematuria    Rx / DC Orders ED Discharge Orders          Ordered    doxycycline (VIBRA-TABS) 100 MG tablet  2 times daily        04/09/23 1542    nitrofurantoin, macrocrystal-monohydrate, (MACROBID) 100 MG capsule  2 times daily        04/09/23 1655              Florice Hindle, [place], PA-C 04/09/23 1658    Pricilla Loveless, MD 04/13/23 1224

## 2023-04-09 NOTE — ED Notes (Signed)
Pt verbalized understanding of discharge instructions. Opportunity for questions provided.  

## 2023-04-10 LAB — RPR: RPR Ser Ql: NONREACTIVE

## 2023-04-11 LAB — GC/CHLAMYDIA PROBE AMP (~~LOC~~) NOT AT ARMC
Chlamydia: POSITIVE — AB
Comment: NEGATIVE
Comment: NORMAL
Neisseria Gonorrhea: NEGATIVE
